# Patient Record
Sex: Female | Born: 1985 | Race: White | Hispanic: No | Marital: Single | Smoking: Never smoker
Health system: Southern US, Community
[De-identification: ages and names within clinical notes are randomized; demographics above are authoritative.]

## PROBLEM LIST (undated history)

## (undated) DIAGNOSIS — I370 Nonrheumatic pulmonary valve stenosis: Secondary | ICD-10-CM

## (undated) DIAGNOSIS — R011 Cardiac murmur, unspecified: Secondary | ICD-10-CM

## (undated) DIAGNOSIS — R55 Syncope and collapse: Secondary | ICD-10-CM

## (undated) HISTORY — DX: Nonrheumatic pulmonary valve stenosis: I37.0

## (undated) HISTORY — PX: OTHER SURGICAL HISTORY: SHX169

## (undated) HISTORY — DX: Syncope and collapse: R55

## (undated) HISTORY — DX: Cardiac murmur, unspecified: R01.1

---

## 2014-05-05 ENCOUNTER — Encounter: Payer: Self-pay | Admitting: Cardiovascular Disease

## 2014-05-05 ENCOUNTER — Encounter (INDEPENDENT_AMBULATORY_CARE_PROVIDER_SITE_OTHER): Payer: Self-pay

## 2014-05-05 ENCOUNTER — Ambulatory Visit (INDEPENDENT_AMBULATORY_CARE_PROVIDER_SITE_OTHER): Payer: BLUE CROSS/BLUE SHIELD | Admitting: Cardiovascular Disease

## 2014-05-05 VITALS — BP 120/78 | HR 68 | Ht 66.0 in | Wt 130.0 lb

## 2014-05-05 DIAGNOSIS — I379 Nonrheumatic pulmonary valve disorder, unspecified: Secondary | ICD-10-CM | POA: Insufficient documentation

## 2014-05-05 DIAGNOSIS — I370 Nonrheumatic pulmonary valve stenosis: Secondary | ICD-10-CM

## 2014-05-05 NOTE — Progress Notes (Signed)
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 She is to follow-up there in a regular basis but has not been seen in more than 5 years. She was told about pulmonary valve regurgitation and right ventricular enlargement during last visit. She denies any symptoms whatsoever. She is not a smoker and has no family history of coronary artery disease.  No Known Allergies   No current outpatient prescriptions on file prior to visit.   No current facility-administered medications on file prior to visit.     Past Medical History  Diagnosis Date  . Heart murmur   . Pulmonary stenosis     Congenital pulmonary valve stenosis status post surgical repair at [age] year old  . Syncope and collapse      Past Surgical History  Procedure Laterality Date  . Open heart surgery      [facility]     Family History  Problem Relation Age of Onset  . Family history unknown: Yes     History   Social History  . Marital Status: Married    Spouse Name: N/A    Number of Children: N/A  . Years of Education: N/A   Occupational History  . Not on file.   Social History Main Topics  . Smoking status: Never Smoker   . Smokeless tobacco: Not on file  . Alcohol Use: Yes     Comment: socially  . Drug Use: No  . Sexual Activity: Not on file   Other Topics Concern  . Not on file   Social History Narrative  . No narrative on file     ROS A 10 point review of system was performed. It is negative other than that mentioned in the history of present illness.   PHYSICAL EXAM   BP 120/78 mmHg  Pulse 68  Ht 5\' 6"  (1.676 m)  Wt 130 lb (58.968 kg)  BMI 20.99 kg/m2 Constitutional: She is oriented to person, place, and time. She appears well-developed and well-nourished. No distress.    HENT: No nasal discharge.  Head: Normocephalic and atraumatic.  Eyes: Pupils are equal and round. No discharge.  Neck: Normal range of motion. Neck supple. No JVD present. No thyromegaly present.  Cardiovascular: Normal rate, regular rhythm, normal heart sounds. Exam reveals no gallop and no friction rub. There is one out of 6 systolic ejection murmur at the pulmonic area. There is 2/6 diastolic murmur at the left sternal border. A surgical scar is noted from the mid chest to the epigastric area. Pulmonary/Chest: Effort normal and breath sounds normal. No stridor. No respiratory distress. She has no wheezes. She has no rales. She exhibits no tenderness.  Abdominal: Soft. Bowel sounds are normal. She exhibits no distension. There is no tenderness. There is no rebound and no guarding.  Musculoskeletal: Normal range of motion. She exhibits no edema and no tenderness.  Neurological: She is alert and oriented to person, place, and time. Coordination normal.  Skin: Skin is warm and dry. No rash noted. She is not diaphoretic. No erythema. No pallor.  Psychiatric: She has a normal mood and affect. Her behavior is normal. Judgment and thought content normal.     EKG: Sinus  Rhythm  -Incomplete right bundle branch block.   ABNORMAL     ASSESSMENT AND PLAN

## 2014-05-05 NOTE — Patient Instructions (Signed)

## 2014-05-05 NOTE — Assessment & Plan Note (Signed)
The patient has history of surgical repair of pulmonary valve stenosis. There is physical exam evidence of pulmonary regurgitation and possible right ventricular enlargement. She is asymptomatic. I requested an echocardiogram for evaluation. She might require pulmonary valve replacement at some point. She reports being followed in the past with cardiac MRIs which might be needed depending on the results of echocardiogram. Endocarditis prophylaxis is not recommended at the present time.

## 2014-05-19 ENCOUNTER — Other Ambulatory Visit (INDEPENDENT_AMBULATORY_CARE_PROVIDER_SITE_OTHER): Payer: BLUE CROSS/BLUE SHIELD

## 2014-05-19 ENCOUNTER — Other Ambulatory Visit: Payer: Self-pay

## 2014-05-19 DIAGNOSIS — R011 Cardiac murmur, unspecified: Secondary | ICD-10-CM

## 2014-05-19 DIAGNOSIS — I370 Nonrheumatic pulmonary valve stenosis: Secondary | ICD-10-CM

## 2014-05-19 DIAGNOSIS — I379 Nonrheumatic pulmonary valve disorder, unspecified: Secondary | ICD-10-CM

## 2015-06-19 ENCOUNTER — Ambulatory Visit (INDEPENDENT_AMBULATORY_CARE_PROVIDER_SITE_OTHER): Payer: BC Managed Care – PPO | Admitting: Cardiovascular Disease

## 2015-06-19 ENCOUNTER — Encounter: Payer: Self-pay | Admitting: Cardiovascular Disease

## 2015-06-19 ENCOUNTER — Encounter (INDEPENDENT_AMBULATORY_CARE_PROVIDER_SITE_OTHER): Payer: Self-pay

## 2015-06-19 VITALS — BP 120/70 | HR 75 | Ht 66.0 in | Wt 126.0 lb

## 2015-06-19 DIAGNOSIS — I379 Nonrheumatic pulmonary valve disorder, unspecified: Secondary | ICD-10-CM | POA: Diagnosis not present

## 2015-06-19 NOTE — Progress Notes (Signed)
Cardiology Office Note   Date:  06/19/2015   ID:  Kathleen Scott, DOB 1985-04-27, MRN [id number]  PCP:  Lorie Phenix, MD  Cardiologist:   Lorine Bears, MD   Chief Complaint  Patient presents with  . other    12 month follow up. "doing well."       History of Present Illness: Kathleen Scott is a 30 y.o. female who presents for a follow up visit.  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 She is to follow-up there in a regular basis but has not been seen in more than 5 years. She was told about pulmonary valve regurgitation and right ventricular enlargement during last visit. She denies any symptoms whatsoever. She is not a smoker and has no family history of coronary artery disease. Most recent echocardiogram in February 2016 in our office showed an ejection fraction of 50-55%, mildly dilated right ventricle with normal systolic function and moderate pulmonary valve regurgitation without evidence of pulmonary hypertension.    Past Medical History  Diagnosis Date  . Heart murmur   . Pulmonary stenosis     Congenital pulmonary valve stenosis status post surgical repair at one day old  . Syncope and collapse     Past Surgical History  Procedure Laterality Date  . Open heart surgery      [facility]     No current outpatient prescriptions on file.   No current facility-administered medications for this visit.    Allergies:   Review of patient's allergies indicates no known allergies.    Social History:  The patient  reports that she has never smoked. She does not have any smokeless tobacco history on file. She reports that she drinks alcohol. She reports that she does not use illicit drugs.   Family History:  The patient's Family history is unknown by patient.    ROS:  Please see the history of present illness.   Otherwise, review of systems are positive for  none.   All other systems are reviewed and negative.    PHYSICAL EXAM: VS:  BP 120/70 mmHg  Pulse 75  Ht  (1.676 m)  Wt 126 lb (57.153 kg)  BMI 20.35 kg/m2 , BMI Body mass index is 20.35 kg/(m^2). GEN: Well nourished, well developed, in no acute distress HEENT: normal Neck: no JVD, carotid bruits, or masses Cardiac: RRR; there is a 2/6 diastolic decrescendo murmur in the pulmonic area with a right sided S3 gallop. Respiratory:  clear to auscultation bilaterally, normal work of breathing GI: soft, nontender, nondistended, + BS MS: no deformity or atrophy Skin: warm and dry, no rash Neuro:  Strength and sensation are intact Psych: euthymic mood, full affect   EKG:  EKG is ordered today. The ekg ordered today demonstrates normal sinus rhythm with significant ST or T wave changes.   Recent Labs: No results found for requested labs within last 365 days.    Lipid Panel No results found for: CHOL, TRIG, HDL, CHOLHDL, VLDL, LDLCALC, LDLDIRECT    Wt Readings from Last 3 Encounters:  06/19/15 126 lb (57.153 kg)  05/05/14 130 lb (58.968 kg)        ASSESSMENT AND PLAN:  1.  Pulmonary valve disease: The patient has history of surgical repair of pulmonary valve stenosis. She is known to have moderate pulmonary valve regurgitation. Her physical exam today is suggestive of a right-sided  S3 gallop.  Due to that, I requested an echocardiogram. She is otherwise asymptomatic.     Disposition:   FU with me in 1 year  Signed,  Lorine BearsMuhammad Arida, MD  06/19/2015 7:14 PM     [facility]

## 2015-06-19 NOTE — Patient Instructions (Addendum)
Labwork: None Ordered  Testing/Procedures: Your physician has requested that you have an echocardiogram. Echocardiography is a painless test that uses sound waves to create images of your heart. It provides your doctor with information about the size and shape of your heart and how well your heart's chambers and valves are working. This procedure takes approximately one hour. There are no restrictions for this procedure.  Date & Time:______________________________________________________________  Follow-Up: Your physician wants you to follow-up in: 1 year with Dr. Kirke CorinArida. You will receive a reminder letter in the mail two months in advance. If you don't receive a letter, please call our office to schedule the follow-up appointment.   Any Other Special Instructions Will Be Listed Below (If Applicable).     If you need a refill on your cardiac medications before your next appointment, please call your pharmacy.  Echocardiogram An echocardiogram, or echocardiography, uses sound waves (ultrasound) to produce an image of your heart. The echocardiogram is simple, painless, obtained within a short period of time, and offers valuable information to your health care provider. The images from an echocardiogram can provide information such as:  Evidence of coronary artery disease (CAD).  Heart size.  Heart muscle function.  Heart valve function.  Aneurysm detection.  Evidence of a past heart attack.  Fluid buildup around the heart.  Heart muscle thickening.  Assess heart valve function. LET [facility] PROVID[facility] KNOW ABOUT:  Any allergies you have.  All medicines you are taking, including vitamins, herbs, eye drops, creams, and over-the-counter medicines.  Previous problems you or members of your family have had with the use of anesthetics.  Any blood disorders you have.  Previous surgeries you have had.  Medical conditions you have.  Possibility of pregnancy, if this  applies. BEFORE THE PROCEDURE  No special preparation is needed. Eat and drink normally.  PROCEDURE   In order to produce an image of your heart, gel will be applied to your chest and a wand-like tool (transducer) will be moved over your chest. The gel will help transmit the sound waves from the transducer. The sound waves will harmlessly bounce off your heart to allow the heart images to be captured in real-time motion. These images will then be recorded.  You may need an IV to receive a medicine that improves the quality of the pictures. AFTER THE PROCEDURE You may return to your normal schedule including diet, activities, and medicines, unless your health care provider tells you otherwise.   This information is not intended to replace advice given to you by your health care provider. Make sure you discuss any questions you have with your health care provider.   Document Released: 03/18/2000 Document Revised: 04/11/2014 Document Reviewed: 11/26/2012 Elsevier Interactive Patient Education [employer]2016 Elsevier Inc.

## 2015-07-02 ENCOUNTER — Other Ambulatory Visit: Payer: Self-pay

## 2015-07-02 ENCOUNTER — Ambulatory Visit (INDEPENDENT_AMBULATORY_CARE_PROVIDER_SITE_OTHER): Payer: BC Managed Care – PPO

## 2015-07-02 DIAGNOSIS — I379 Nonrheumatic pulmonary valve disorder, unspecified: Secondary | ICD-10-CM | POA: Diagnosis not present

## 2016-09-06 ENCOUNTER — Ambulatory Visit (INDEPENDENT_AMBULATORY_CARE_PROVIDER_SITE_OTHER): Payer: BC Managed Care – PPO | Admitting: Cardiovascular Disease

## 2016-09-06 ENCOUNTER — Encounter: Payer: Self-pay | Admitting: Cardiovascular Disease

## 2016-09-06 VITALS — BP 100/62 | HR 70 | Ht 66.0 in | Wt 131.2 lb

## 2016-09-06 DIAGNOSIS — I379 Nonrheumatic pulmonary valve disorder, unspecified: Secondary | ICD-10-CM

## 2016-09-06 NOTE — Patient Instructions (Signed)
Medication Instructions: None.   Labwork: None.   Procedures/Testing: None.   Follow-Up: 1 year with Dr. Kirke CorinArida.   Any Additional Special Instructions Will Be Listed Below (If Applicable).     If you need a refill on your cardiac medications before your next appointment, please call your pharmacy.

## 2016-09-06 NOTE — Progress Notes (Signed)
Cardiology Office Note   Date:  09/06/2016   ID:  Kathleen Scott, DOB 01-20-1986, MRN [id number]  PCP:  Lorie Phenix, MD  Cardiologist:   Lorine Bears, MD   Chief Complaint  Patient presents with  . other    12 month follow up. Meds reviewed by the pt. verbally. "doing well."       History of Present Illness: Kathleen Scott is a 31 y.o. female who presents for a follow up visit.  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  She has known moderate pulmonary valve regurgitation with mild RV enlargement.  Most recent echocardiogram in March 2017 showed an EF of 50-55%, mild mitral regurgitation, mild tricuspid regurgitation with no pulmonary hypertension, moderate pulmonary valve regurgitation with mildly dilated right ventricle.  She has been doing extremely well with no cardiac symptoms. No chest pain, shortness of breath or palpitations. She continues to work as a Runner, broadcasting/film/video.   Past Medical History:  Diagnosis Date  . Heart murmur   . Pulmonary stenosis    Congenital pulmonary valve stenosis status post surgical repair at one day old  . Syncope and collapse     Past Surgical History:  Procedure Laterality Date  . open heart surgery     [facility]     No current outpatient prescriptions on file.   No current facility-administered medications for this visit.     Allergies:   Patient has no known allergies.    Social History:  The patient  reports that she has never smoked. She has never used smokeless tobacco. She reports that she drinks alcohol. She reports that she does not use drugs.   Family History:  The patient's Family history is unknown by patient.    ROS:  Please see the history of present illness.   Otherwise, review of systems are positive for none.   All other systems are reviewed and negative.    PHYSICAL EXAM: VS:  BP 100/62 (BP Location:  Left Arm, Patient Position: Sitting, Cuff Size: Normal)   Pulse 70   Ht 5\' 6"  (1.676 m)   Wt 131 lb 4 oz (59.5 kg)   BMI 21.18 kg/m  , BMI Body mass index is 21.18 kg/m. GEN: Well nourished, well developed, in no acute distress  HEENT: normal  Neck: no JVD, carotid bruits, or masses Cardiac: RRR; there is a 2/6 diastolic decrescendo murmur in the pulmonic area . Respiratory:  clear to auscultation bilaterally, normal work of breathing GI: soft, nontender, nondistended, + BS MS: no deformity or atrophy  Skin: warm and dry, no rash Neuro:  Strength and sensation are intact Psych: euthymic mood, full affect   EKG:  EKG is ordered today. The ekg ordered today demonstrates normal sinus rhythm with significant ST or T wave changes.   Recent Labs: No results found for requested labs within last 8760 hours.    Lipid Panel No results found for: CHOL, TRIG, HDL, CHOLHDL, VLDL, LDLCALC, LDLDIRECT    Wt Readings from Last 3 Encounters:  09/06/16 131 lb 4 oz (59.5 kg)  06/19/15 126 lb (57.2 kg)  05/05/14 130 lb (59 kg)        ASSESSMENT AND PLAN:  1.  Pulmonary valve disease: The patient has history of surgical repair of pulmonary valve stenosis. She is known to have moderate pulmonary valve regurgitation. This was stable on echocardiogram from last  year which was reviewed with her today. She is asymptomatic.    Disposition:   FU with me in 1 year  Signed,  Lorine BearsMuhammad Arida, MD  09/06/2016 5:10 PM    [facility]

## 2017-09-25 ENCOUNTER — Encounter: Payer: Self-pay | Admitting: Family Medicine

## 2017-09-25 ENCOUNTER — Encounter (INDEPENDENT_AMBULATORY_CARE_PROVIDER_SITE_OTHER): Payer: Self-pay

## 2017-09-25 ENCOUNTER — Ambulatory Visit: Payer: BC Managed Care – PPO | Admitting: Family Medicine

## 2017-09-25 VITALS — BP 108/70 | HR 72 | Temp 98.4°F | Ht 66.5 in | Wt 137.5 lb

## 2017-09-25 DIAGNOSIS — N926 Irregular menstruation, unspecified: Secondary | ICD-10-CM | POA: Diagnosis not present

## 2017-09-25 DIAGNOSIS — Z3201 Encounter for pregnancy test, result positive: Secondary | ICD-10-CM

## 2017-09-25 DIAGNOSIS — Z7689 Persons encountering health services in other specified circumstances: Secondary | ICD-10-CM

## 2017-09-25 DIAGNOSIS — I379 Nonrheumatic pulmonary valve disorder, unspecified: Secondary | ICD-10-CM | POA: Diagnosis not present

## 2017-09-25 LAB — POCT URINE PREGNANCY: Preg Test, Ur: POSITIVE — AB

## 2017-09-25 NOTE — Patient Instructions (Signed)
First Trimester of Pregnancy The first trimester of pregnancy is from week 1 until the end of week 13 (months 1 through 3). During this time, your baby will begin to develop inside you. At 6-8 weeks, the eyes and face are formed, and the heartbeat can be seen on ultrasound. At the end of 12 weeks, all the baby's organs are formed. Prenatal care is all the medical care you receive before the birth of your baby. Make sure you get good prenatal care and follow all of your doctor's instructions. Follow these instructions at home: Medicines  Take over-the-counter and prescription medicines only as told by your doctor. Some medicines are safe and some medicines are not safe during pregnancy.  Take a prenatal vitamin that contains at least 600 micrograms (mcg) of folic acid.  If you have trouble pooping (constipation), take medicine that will make your stool soft (stool softener) if your doctor approves. Eating and drinking  Eat regular, healthy meals.  Your doctor will tell you the amount of weight gain that is right for you.  Avoid raw meat and uncooked cheese.  If you feel sick to your stomach (nauseous) or throw up (vomit): ? Eat 4 or 5 small meals a day instead of 3 large meals. ? Try eating a few soda crackers. ? Drink liquids between meals instead of during meals.  To prevent constipation: ? Eat foods that are high in fiber, like fresh fruits and vegetables, whole grains, and beans. ? Drink enough fluids to keep your pee (urine) clear or pale yellow. Activity  Exercise only as told by your doctor. Stop exercising if you have cramps or pain in your lower belly (abdomen) or low back.  Do not exercise if it is too hot, too humid, or if you are in a place of great height (high altitude).  Try to avoid standing for long periods of time. Move your legs often if you must stand in one place for a long time.  Avoid heavy lifting.  Wear low-heeled shoes. Sit and stand up straight.  You  can have sex unless your doctor tells you not to. Relieving pain and discomfort  Wear a good support bra if your breasts are sore.  Take warm water baths (sitz baths) to soothe pain or discomfort caused by hemorrhoids. Use hemorrhoid cream if your doctor says it is okay.  Rest with your legs raised if you have leg cramps or low back pain.  If you have puffy, bulging veins (varicose veins) in your legs: ? Wear support hose or compression stockings as told by your doctor. ? Raise (elevate) your feet for 15 minutes, 3-4 times a day. ? Limit salt in your food. Prenatal care  Schedule your prenatal visits by the twelfth week of pregnancy.  Write down your questions. Take them to your prenatal visits.  Keep all your prenatal visits as told by your doctor. This is important. Safety  Wear your seat belt at all times when driving.  Make a list of emergency phone numbers. The list should include numbers for family, friends, the hospital, and police and fire departments. General instructions  Ask your doctor for a referral to a local prenatal class. Begin classes no later than at the start of month 6 of your pregnancy.  Ask for help if you need counseling or if you need help with nutrition. Your doctor can give you advice or tell you where to go for help.  Do not use hot tubs, steam rooms, or   saunas.  Do not douche or use tampons or scented sanitary pads.  Do not cross your legs for long periods of time.  Avoid all herbs and alcohol. Avoid drugs that are not approved by your doctor.  Do not use any tobacco products, including cigarettes, chewing tobacco, and electronic cigarettes. If you need help quitting, ask your doctor. You may get counseling or other support to help you quit.  Avoid cat litter boxes and soil used by cats. These carry germs that can cause birth defects in the baby and can cause a loss of your baby (miscarriage) or stillbirth.  Visit your dentist. At home, brush  your teeth with a soft toothbrush. Be gentle when you floss. Contact a doctor if:  You are dizzy.  You have mild cramps or pressure in your lower belly.  You have a nagging pain in your belly area.  You continue to feel sick to your stomach, you throw up, or you have watery poop (diarrhea).  You have a bad smelling fluid coming from your vagina.  You have pain when you pee (urinate).  You have increased puffiness (swelling) in your face, hands, legs, or ankles. Get help right away if:  You have a fever.  You are leaking fluid from your vagina.  You have spotting or bleeding from your vagina.  You have very bad belly cramping or pain.  You gain or lose weight rapidly.  You throw up blood. It may look like coffee grounds.  You are around people who have German measles, fifth disease, or chickenpox.  You have a very bad headache.  You have shortness of breath.  You have any kind of trauma, such as from a fall or a car accident. Summary  The first trimester of pregnancy is from week 1 until the end of week 13 (months 1 through 3).  To take care of yourself and your unborn baby, you will need to eat healthy meals, take medicines only if your doctor tells you to do so, and do activities that are safe for you and your baby.  Keep all follow-up visits as told by your doctor. This is important as your doctor will have to ensure that your baby is healthy and growing well. This information is not intended to replace advice given to you by your health care provider. Make sure you discuss any questions you have with your health care provider. Document Released: 09/07/2007 Document Revised: 03/29/2016 Document Reviewed: 03/29/2016 Elsevier Interactive Patient Education  2017 Elsevier Inc.  

## 2017-09-25 NOTE — Progress Notes (Signed)
   Subjective:    Patient ID: Kathleen Scott, female    DOB: 23-Sep-1985, 32 y.o.   MRN: [id number]  HPI This is a 32 yo female who presents today to establish care. Is accompanied by her boyfriend.   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 Enjoys hanging out with friends, walking, exercise.    Last CPE-  Several years Pap- 5-6 years ago, never abnormal Eye- not for many years Dental-  Twice a year Exercise- boot camp  Patient's last menstrual period was 07/31/2017. Had positive home pregnancy test. Some nausea, some breast tenderness. She requests referral to ob.   Congenital pulmonary valve stenosis with post surgical repair- sees cardiologist annually. Last OV 09/06/16. Note reviewed. Follow up in 1 year. Patient did not realize she is overdue. She denies chest pain, SOB, edema, palpitations.   Past Medical History:  Diagnosis Date  . Heart murmur   . Pulmonary stenosis    Congenital pulmonary valve stenosis status post surgical repair at one day old  . Syncope and collapse    Past Surgical History:  Procedure Laterality Date  . open heart surgery     [facility]   Family History  Family history unknown: Yes   Social History   Tobacco Use  . Smoking status: Never Smoker  . Smokeless tobacco: Never Used  Substance Use Topics  . Alcohol use: Not Currently  . Drug use: No      Review of Systems  Constitutional: Positive for fatigue.  Respiratory: Negative for chest tightness and shortness of breath.   Cardiovascular: Negative for chest pain, palpitations and leg swelling.  Gastrointestinal: Positive for nausea. Negative for vomiting.  Psychiatric/Behavioral: Negative for dysphoric mood and sleep disturbance. The patient is not nervous/anxious.        Objective:   Physical Exam  Constitutional: She is oriented to person, place, and time. She appears well-developed and well-nourished. No distress.  HENT:  Head: Normocephalic and atraumatic.    Eyes: Conjunctivae are normal.  Cardiovascular: Normal rate and regular rhythm.  Murmur (3/6) heard. Pulmonary/Chest: Effort normal and breath sounds normal.  Musculoskeletal: She exhibits no edema.  Neurological: She is alert and oriented to person, place, and time.  Skin: Skin is warm and dry. She is not diaphoretic.  Psychiatric: She has a normal mood and affect. Her behavior is normal. Judgment and thought content normal.  Vitals reviewed.     BP 108/70 (BP Location: Left Arm, Patient Position: Sitting, Cuff Size: Normal)   Pulse 72   Temp 98.4 F (36.9 C) (Oral)   Ht 5' 6.5" (1.689 m)   Wt 137 lb 8 oz (62.4 kg)   LMP 07/31/2017   SpO2 98%   BMI 21.86 kg/m  Results for orders placed or performed in visit on 09/25/17  POCT urine pregnancy  Result Value Ref Range   Preg Test, Ur Positive (A) Negative       Assessment & Plan:  1. Encounter to establish care - Available records reviewed - no recent care - will defer labs to ob/gyn  2. Missed period - POCT urine pregnancy  3. Positive pregnancy test - provided information about first trimester of pregnancy, advised to avoid ETOH,  Tobacco, drugs - Ambulatory referral to Obstetrics / Gynecology  4. Pulmonary valve disease - she will make follow up with cardilogy   Olean Reeeborah Sonnia Strong, FNP-BC  [facility], [facility]  09/25/2017 10:20 AM

## 2017-10-04 ENCOUNTER — Encounter: Payer: Self-pay | Admitting: Nurse Practitioner

## 2017-10-04 ENCOUNTER — Other Ambulatory Visit (HOSPITAL_COMMUNITY)
Admission: RE | Admit: 2017-10-04 | Discharge: 2017-10-04 | Disposition: A | Discharge status: Home / Self Care | Payer: BC Managed Care – PPO | Source: Ambulatory Visit | Attending: Obstetrics & Gynecology | Admitting: Obstetrics & Gynecology

## 2017-10-04 ENCOUNTER — Encounter: Payer: Self-pay | Admitting: Obstetrics & Gynecology

## 2017-10-04 ENCOUNTER — Ambulatory Visit (INDEPENDENT_AMBULATORY_CARE_PROVIDER_SITE_OTHER): Payer: BC Managed Care – PPO | Admitting: Obstetrics & Gynecology

## 2017-10-04 ENCOUNTER — Ambulatory Visit: Payer: BC Managed Care – PPO | Admitting: Nurse Practitioner

## 2017-10-04 VITALS — BP 100/70 | HR 59 | Ht 66.0 in | Wt 137.2 lb

## 2017-10-04 VITALS — BP 120/80 | Wt 138.0 lb

## 2017-10-04 DIAGNOSIS — Z3A09 9 weeks gestation of pregnancy: Secondary | ICD-10-CM | POA: Insufficient documentation

## 2017-10-04 DIAGNOSIS — I371 Nonrheumatic pulmonary valve insufficiency: Secondary | ICD-10-CM | POA: Diagnosis not present

## 2017-10-04 DIAGNOSIS — Z8679 Personal history of other diseases of the circulatory system: Secondary | ICD-10-CM | POA: Insufficient documentation

## 2017-10-04 DIAGNOSIS — N926 Irregular menstruation, unspecified: Secondary | ICD-10-CM

## 2017-10-04 DIAGNOSIS — O0991 Supervision of high risk pregnancy, unspecified, first trimester: Secondary | ICD-10-CM | POA: Diagnosis present

## 2017-10-04 LAB — OB RESULTS CONSOLE GC/CHLAMYDIA: Gonorrhea: NEGATIVE

## 2017-10-04 LAB — OB RESULTS CONSOLE VARICELLA ZOSTER ANTIBODY, IGG: Varicella: IMMUNE

## 2017-10-04 MED ORDER — DOXYLAMINE-PYRIDOXINE ER 20-20 MG PO TBCR: 1.0000 | EXTENDED_RELEASE_TABLET | Freq: Two times a day (BID) | ORAL | 3 refills | Status: DC

## 2017-10-04 MED ORDER — ONDANSETRON 4 MG PO TBDP: 4.0000 mg | ORAL_TABLET | Freq: Four times a day (QID) | ORAL | 0 refills | Status: DC | PRN

## 2017-10-04 NOTE — Progress Notes (Signed)
10/04/2017   Chief Complaint: Missed period  Transfer of Care Patient: no  History of Present Illness: Kathleen Scott is a 32 y.o. G1P0 [redacted]w[redacted]d based on Patient's last menstrual period was 07/31/2017. with an Estimated Date of Delivery: 05/07/18, with the above CC.   Her periods were: regular periods every 28 days She was using no method when she conceived.  She has Positive signs or symptoms of nausea/vomiting of pregnancy. 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  OBGYN History: As per HPI. OB History  Gravida Para Term Preterm AB Living  1            SAB TAB Ectopic Multiple Live Births               # Outcome Date GA Lbr Len/2nd Weight Sex Delivery Anes PTL Lv  1 Current             Any issues with any prior pregnancies: not applicable Any prior children are healthy, doing well, without any problems or issues: no History of pap smears: Yes. Last pap smear 2018. Abnormal: no History of STIs: No   Past Medical History: Past Medical History:  Diagnosis Date  . Congenital Pulmonic Stenosis    a. 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 06/2015 Echo: EF 50-55%, septal wall HK 2/2 post-op state. Mild MR. Mildly dil RV size. Nl RV fxn. Triv PR. Nl PASP.  Marland Kitchen Heart murmur   . Syncope and collapse     Past Surgical History: Past Surgical History:  Procedure Laterality Date  . open heart surgery     [facility]    Family History:  Family History  Family history unknown: Yes   She denies any female cancers, bleeding or blood clotting disorders.  She denies  any history of mental retardation, birth defects or genetic disorders in her or the FOB's history  Social History:  Social History   Socioeconomic History  . Marital status: Single    Spouse name: Not on file  . Number of children: Not on file  . Years of education: Not on file  . Highest education level: Not on file  Occupational History  . Not on file  Social Needs  . Financial resource strain: Not on file  . Food insecurity:    Worry: Not on file    Inability: Not on file  . Transportation needs:    Medical: Not on file    Non-medical: Not on file  Tobacco Use  . Smoking status: Never Smoker  . Smokeless tobacco: Never Used  Substance and Sexual Activity  . Alcohol use: Not Currently  . Drug use: No  . Sexual activity: Not on file  Lifestyle  . Physical activity:    Days per week: Not on file    Minutes per session: Not on file  . Stress: Not on file  Relationships  . Social connections:    Talks on phone: Not on file    Gets together: Not on file    Attends religious service: Not on file    Active  member of club or organization: Not on file    Attends meetings of clubs or organizations: Not on file    Relationship status: Not on file  . Intimate partner violence:    Fear of current or ex partner: Not on file    Emotionally abused: Not on file    Physically abused: Not on file    Forced sexual activity: Not on file  Other Topics Concern  . Not on file  Social History Narrative  . Not on file   Any pets in the household: no  Allergy: No Known Allergies  Current Outpatient Medications:  Current Outpatient Medications:  .  Doxylamine-Pyridoxine ER (BONJESTA) 20-20 MG TBCR, Take 1 tablet by mouth 2 (two) times daily., Disp: 60 tablet, Rfl: 3 .  ondansetron (ZOFRAN ODT) 4 MG disintegrating tablet, Take 1 tablet (4 mg total) by mouth every 6 (six) hours as needed for nausea., Disp: 20 tablet, Rfl: 0 .  Prenatal Vit-Fe Fumarate-FA (PRENATAL MULTIVITAMIN) TABS  tablet, Take 1 tablet by mouth daily at 12 noon., Disp: , Rfl:    Physical Exam:   BP 120/80   Wt 138 lb (62.6 kg)   LMP 07/31/2017   BMI 21.94 kg/m  Body mass index is 21.94 kg/m. Constitutional: Well nourished, well developed female in no acute distress.  Neck:  Supple, normal appearance, and no thyromegaly  Cardiovascular: S1, S2 normal, no murmur, rub or gallop, regular rate and rhythm Respiratory:  Clear to auscultation bilateral. Normal respiratory effort Abdomen: positive bowel sounds and no masses, hernias; diffusely non tender to palpation, non distended Breasts: breasts appear normal, no suspicious masses, no skin or nipple changes or axillary nodes. Neuro/Psych:  Normal mood and affect.  Skin:  Warm and dry.  Lymphatic:  No inguinal lymphadenopathy.   Pelvic exam: is not limited by body habitus EGBUS: within normal limits, Vagina: within normal limits and with no blood in the vault, Cervix: normal appearing cervix without discharge or lesions, closed/long/high, Uterus:  enlarged: 8 wks, and Adnexa:  normal adnexa  Assessment: [name] is a 32 y.o. G1P0 7683w2d based on Patient's last menstrual period was 07/31/2017. with an Estimated Date of Delivery: 05/07/18,  for prenatal care.  Plan:  1) Avoid alcoholic beverages. 2) Patient encouraged not to smoke.  3) Discontinue the use of all non-medicinal drugs and chemicals.  4) Take prenatal vitamins daily.  5) Seatbelt use advised 6) Nutrition, food safety (fish, cheese advisories, and high nitrite foods) and exercise discussed. 7) Hospital and practice style delivering [facility] discussed  8) Patient is asked about travel to areas at risk for the Zika virus, and counseled to avoid travel and exposure to mosquitoes or sexual partners who may have themselves been exposed to the virus. Testing is discussed, and will be ordered as appropriate.  9) [facility]birth [facility] advised 10) Genetic Screening, such as with 1st Trimester  Screening, cell free fetal DNA, AFP testing, and Ultrasound, as well as with amniocentesis and CVS as appropriate, is discussed with patient. She plans to have genetic testing this pregnancy. 11) Plan fetal ECHO due to her h/o pulm valve stenosis 12) Bonjesta and Zofran for nausea 13) US soon  Problem list reviewed and updated.  Annamarie MajorPaul Shamon Lobo, MD, Merlinda FrederickFACOG [facility], [facility] 10/04/2017  1:45 PM

## 2017-10-04 NOTE — Patient Instructions (Signed)
Medication Instructions:  Your physician recommends that you continue on your current medications as directed. Please refer to the Current Medication list given to you today.   Labwork: None ordered  Testing/Procedures: Your physician has requested that you have an echocardiogram. Echocardiography is a painless test that uses sound waves to create images of your heart. It provides your doctor with information about the size and shape of your heart and how well your heart's chambers and valves are working. This procedure takes approximately one hour. There are no restrictions for this procedure.   Follow-Up: Your physician recommends that you schedule a follow-up appointment in: 3 months with Dr.Arida   Any Other Special Instructions Will Be Listed Below (If Applicable).     If you need a refill on your cardiac medications before your next appointment, please call your pharmacy.

## 2017-10-04 NOTE — Progress Notes (Signed)
Office Visit    Patient Name: Kathleen Scott Date of Encounter: 10/04/2017  Primary Care Provider:  Emi BelfastGessner, Deborah B, FNP Primary Cardiologist:  Lorine BearsMuhammad Arida, MD  Chief Complaint    32 year old female with a history of congenital pulmonic stenosis status post surgical repair at 91-day-old with residual pulmonary regurgitation, who presents for follow-up after recent discovery of pregnancy.  Past Medical History    Past Medical History:  Diagnosis Date  . Congenital Pulmonic Stenosis    a. 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 06/2015 Echo: EF 50-55%, septal wall HK 2/2 post-op state. Mild MR. Mildly dil RV size. Nl RV fxn. Triv PR. Nl PASP.  Marland Kitchen. Heart murmur   . Syncope and collapse    Past Surgical History:  Procedure Laterality Date  . open heart [facility]    Allergies  No Known Allergies  History of Present Illness    32 year old female with a prior history of congenital pulmonary valve stenosis status post surgical repair when she was 261-day-old.  [facility]  She has known pulmonary valve regurgitation which was previously thought to be moderate.  Echocardiogram in March 2017 showed mild mitral regurgitation and trivial pulmonary regurgitation.  The right ventricle was stable and mildly dilated with normal function.  She was asymptomatic when last seen in clinic in June 2018.  She has done well over the past year.  She has remained active without symptoms or limitations.  She denies chest pain, palpitations, dyspnea, or edema.  She recently discovered that she is pregnant and as result set up follow-up with us today.  Home Medications    Prior to Admission medications   Medication Sig Start Date End Date Taking? Authorizing Provider  Prenatal Vit-Fe Fumarate-FA (PRENATAL MULTIVITAMIN) TABS tablet Take 1 tablet by mouth daily at 12 noon.    [provider]    Review of Systems    She denies chest pain, palpitations, dyspnea, pnd, orthopnea, n, v, dizziness, syncope, edema, weight gain, or early satiety.  All other systems reviewed and are otherwise negative except as noted above.  Physical Exam    VS:  BP 100/70 (BP Location: Left Arm, Patient Position: Sitting, Cuff Size: Normal)   Pulse (!) 59   Ht 5\' 6"  (1.676 m)   Wt 137 lb 4 oz (62.3 kg)   LMP 07/31/2017   BMI 22.15 kg/m  , BMI Body mass index is 22.15 kg/m. GEN: Well nourished, well developed, in no acute distress.  HEENT: normal.  Neck: Supple, no JVD, carotid bruits, or masses. Cardiac: RRR, soft systolic murmur at the left upper sternal border.  I do not appreciate a diastolic murmur today.  No rubs, or gallops. No clubbing, cyanosis, edema.  Radials/DP/PT 2+ and equal bilaterally.  Respiratory:  Respirations regular and unlabored, clear to auscultation bilaterally. GI: Soft, nontender, nondistended, BS + x 4. MS: no deformity or atrophy. Skin: warm and dry, no rash. Neuro:  Strength and sensation are intact. Psych: Normal affect.  Accessory Clinical Findings    ECG -sinus bradycardia, 59, with occasional PVCs.  Left atrial enlargement.  Assessment & Plan    1.  Pulmonary valve regurgitation: Patient with a prior history of pulmonic stenosis status post surgery at age 3 day and subsequent development of pulmonary regurgitation.  This was previously noted to be moderate in severity and stable by echo in 2017.  She recently discovered  that she is pregnant.  She has been asymptomatic over the past year and has remained active.  I will follow-up in echocardiogram to reevaluate pulmonary regurgitation and RV function.  With history of moderate pulmonary regurgitation, she is WHO risk class II, with recommendation to be f/u once a trimester.  We will plan to see her back in 3 months.  2.  Pregnancy: Saw OB/GYN today.  Plan for fetal echocardiogram at 22 weeks.  3.  Disposition: Follow-up echocardiogram.   Patient will follow-up in 3 months or sooner if necessary.   Nicolasa Ducking, NP 10/04/2017, 3:22 PM

## 2017-10-04 NOTE — Patient Instructions (Signed)
First Trimester of Pregnancy The first trimester of pregnancy is from week 1 until the end of week 13 (months 1 through 3). A week after a sperm fertilizes an egg, the egg will implant on the wall of the uterus. This embryo will begin to develop into a baby. Genes from you and your partner will form the baby. The female genes will determine whether the baby will be a boy or a girl. At 6-8 weeks, the eyes and face will be formed, and the heartbeat can be seen on ultrasound. At the end of 12 weeks, all the baby's organs will be formed. Now that you are pregnant, you will want to do everything you can to have a healthy baby. Two of the most important things are to get good prenatal care and to follow your health care provider's instructions. Prenatal care is all the medical care you receive before the baby's birth. This care will help prevent, find, and treat any problems during the pregnancy and childbirth. Body changes during your first trimester Your body goes through many changes during pregnancy. The changes vary from woman to woman.  You may gain or lose a couple of pounds at first.  You may feel sick to your stomach (nauseous) and you may throw up (vomit). If the vomiting is uncontrollable, call your health care provider.  You may tire easily.  You may develop headaches that can be relieved by medicines. All medicines should be approved by your health care provider.  You may urinate more often. Painful urination may mean you have a bladder infection.  You may develop heartburn as a result of your pregnancy.  You may develop constipation because certain hormones are causing the muscles that push stool through your intestines to slow down.  You may develop hemorrhoids or swollen veins (varicose veins).  Your breasts may begin to grow larger and become tender. Your nipples may stick out more, and the tissue that surrounds them (areola) may become darker.  Your gums may bleed and may be  sensitive to brushing and flossing.  Dark spots or blotches (chloasma, mask of pregnancy) may develop on your face. This will likely fade after the baby is born.  Your menstrual periods will stop.  You may have a loss of appetite.  You may develop cravings for certain kinds of food.  You may have changes in your emotions from day to day, such as being excited to be pregnant or being concerned that something may go wrong with the pregnancy and baby.  You may have more vivid and strange dreams.  You may have changes in your hair. These can include thickening of your hair, rapid growth, and changes in texture. Some women also have hair loss during or after pregnancy, or hair that feels dry or thin. Your hair will most likely return to normal after your baby is born.  What to expect at prenatal visits During a routine prenatal visit:  You will be weighed to make sure you and the baby are growing normally.  Your blood pressure will be taken.  Your abdomen will be measured to track your baby's growth.  The fetal heartbeat will be listened to between weeks 10 and 14 of your pregnancy.  Test results from any previous visits will be discussed.  Your health care provider may ask you:  How you are feeling.  If you are feeling the baby move.  If you have had any abnormal symptoms, such as leaking fluid, bleeding, severe headaches,   or abdominal cramping.  If you are using any tobacco products, including cigarettes, chewing tobacco, and electronic cigarettes.  If you have any questions.  Other tests that may be performed during your first trimester include:  Blood tests to find your blood type and to check for the presence of any previous infections. The tests will also be used to check for low iron levels (anemia) and protein on red blood cells (Rh antibodies). Depending on your risk factors, or if you previously had diabetes during pregnancy, you may have tests to check for high blood  sugar that affects pregnant women (gestational diabetes).  Urine tests to check for infections, diabetes, or protein in the urine.  An ultrasound to confirm the proper growth and development of the baby.  Fetal screens for spinal cord problems (spina bifida) and Down syndrome.  HIV (human immunodeficiency virus) testing. Routine prenatal testing includes screening for HIV, unless you choose not to have this test.  You may need other tests to make sure you and the baby are doing well.  Follow these instructions at home: Medicines  Follow your health care provider's instructions regarding medicine use. Specific medicines may be either safe or unsafe to take during pregnancy.  Take a prenatal vitamin that contains at least 600 micrograms (mcg) of folic acid.  If you develop constipation, try taking a stool softener if your health care provider approves. Eating and drinking  Eat a balanced diet that includes fresh fruits and vegetables, whole grains, good sources of protein such as meat, eggs, or tofu, and low-fat dairy. Your health care provider will help you determine the amount of weight gain that is right for you.  Avoid raw meat and uncooked cheese. These carry germs that can cause birth defects in the baby.  Eating four or five small meals rather than three large meals a day may help relieve nausea and vomiting. If you start to feel nauseous, eating a few soda crackers can be helpful. Drinking liquids between meals, instead of during meals, also seems to help ease nausea and vomiting.  Limit foods that are high in fat and processed sugars, such as fried and sweet foods.  To prevent constipation: ? Eat foods that are high in fiber, such as fresh fruits and vegetables, whole grains, and beans. ? Drink enough fluid to keep your urine clear or pale yellow. Activity  Exercise only as directed by your health care provider. Most women can continue their usual exercise routine during  pregnancy. Try to exercise for 30 minutes at least 5 days a week. Exercising will help you: ? Control your weight. ? Stay in shape. ? Be prepared for labor and delivery.  Experiencing pain or cramping in the lower abdomen or lower back is a good sign that you should stop exercising. Check with your health care provider before continuing with normal exercises.  Try to avoid standing for long periods of time. Move your legs often if you must stand in one place for a long time.  Avoid heavy lifting.  Wear low-heeled shoes and practice good posture.  You may continue to have sex unless your health care provider tells you not to. Relieving pain and discomfort  Wear a good support bra to relieve breast tenderness.  Take warm sitz baths to soothe any pain or discomfort caused by hemorrhoids. Use hemorrhoid cream if your health care provider approves.  Rest with your legs elevated if you have leg cramps or low back pain.  If you develop   varicose veins in your legs, wear support hose. Elevate your feet for 15 minutes, 3-4 times a day. Limit salt in your diet. Prenatal care  Schedule your prenatal visits by the twelfth week of pregnancy. They are usually scheduled monthly at first, then more often in the last 2 months before delivery.  Write down your questions. Take them to your prenatal visits.  Keep all your prenatal visits as told by your health care provider. This is important. Safety  Wear your seat belt at all times when driving.  Make a list of emergency phone numbers, including numbers for family, friends, the hospital, and police and fire departments. General instructions  Ask your health care provider for a referral to a local prenatal education class. Begin classes no later than the beginning of month 6 of your pregnancy.  Ask for help if you have counseling or nutritional needs during pregnancy. Your health care provider can offer advice or refer you to specialists for help  with various needs.  Do not use hot tubs, steam rooms, or saunas.  Do not douche or use tampons or scented sanitary pads.  Do not cross your legs for long periods of time.  Avoid cat litter boxes and soil used by cats. These carry germs that can cause birth defects in the baby and possibly loss of the fetus by miscarriage or stillbirth.  Avoid all smoking, herbs, alcohol, and medicines not prescribed by your health care provider. Chemicals in these products affect the formation and growth of the baby.  Do not use any products that contain nicotine or tobacco, such as cigarettes and e-cigarettes. If you need help quitting, ask your health care provider. You may receive counseling support and other resources to help you quit.  Schedule a dentist appointment. At home, brush your teeth with a soft toothbrush and be gentle when you floss. Contact a health care provider if:  You have dizziness.  You have mild pelvic cramps, pelvic pressure, or nagging pain in the abdominal area.  You have persistent nausea, vomiting, or diarrhea.  You have a bad smelling vaginal discharge.  You have pain when you urinate.  You notice increased swelling in your face, hands, legs, or ankles.  You are exposed to fifth disease or chickenpox.  You are exposed to German measles (rubella) and have never had it. Get help right away if:  You have a fever.  You are leaking fluid from your vagina.  You have spotting or bleeding from your vagina.  You have severe abdominal cramping or pain.  You have rapid weight gain or loss.  You vomit blood or material that looks like coffee grounds.  You develop a severe headache.  You have shortness of breath.  You have any kind of trauma, such as from a fall or a car accident. Summary  The first trimester of pregnancy is from week 1 until the end of week 13 (months 1 through 3).  Your body goes through many changes during pregnancy. The changes vary from  woman to woman.  You will have routine prenatal visits. During those visits, your health care provider will examine you, discuss any test results you may have, and talk with you about how you are feeling. This information is not intended to replace advice given to you by your health care provider. Make sure you discuss any questions you have with your health care provider. Document Released: 03/15/2001 Document Revised: 03/02/2016 Document Reviewed: 03/02/2016 Elsevier Interactive Patient Education  2018 Elsevier   Inc.  

## 2017-10-05 LAB — RPR+RH+ABO+RUB AB+AB SCR+CB...
Antibody Screen: NEGATIVE
HEMATOCRIT: 41.2 % (ref 34.0–46.6)
HEMOGLOBIN: 13.6 g/dL (ref 11.1–15.9)
HIV SCREEN 4TH GENERATION: NONREACTIVE
Hepatitis B Surface Ag: NEGATIVE
MCH: 28.4 pg (ref 26.6–33.0)
MCHC: 33 g/dL (ref 31.5–35.7)
MCV: 86 fL (ref 79–97)
Platelets: 274 10*3/uL (ref 150–450)
RBC: 4.79 x10E6/uL (ref 3.77–5.28)
RDW: 14.1 % (ref 12.3–15.4)
RH TYPE: NEGATIVE
RPR: NONREACTIVE
RUBELLA: 4.3 {index} (ref >0.99)
Varicella zoster IgG: 706 index (ref >165)
WBC: 8.9 10*3/uL (ref 3.4–10.8)

## 2017-10-05 LAB — DRUG SCREEN, URINE
Amphetamines, Urine: NEGATIVE ng/mL
BARBITURATE SCREEN URINE: NEGATIVE ng/mL
Benzodiazepine Quant, Ur: NEGATIVE ng/mL
Cannabinoid Quant, Ur: NEGATIVE ng/mL
Cocaine (Metab.): NEGATIVE ng/mL
OPIATE QUANT UR: NEGATIVE ng/mL
PCP QUANT UR: NEGATIVE ng/mL

## 2017-10-06 LAB — URINE CULTURE: ORGANISM ID, BACTERIA: NO GROWTH

## 2017-10-09 ENCOUNTER — Ambulatory Visit (INDEPENDENT_AMBULATORY_CARE_PROVIDER_SITE_OTHER): Payer: BC Managed Care – PPO

## 2017-10-09 ENCOUNTER — Other Ambulatory Visit: Payer: Self-pay

## 2017-10-09 DIAGNOSIS — I371 Nonrheumatic pulmonary valve insufficiency: Secondary | ICD-10-CM | POA: Diagnosis not present

## 2017-10-10 LAB — CYTOLOGY - PAP
CHLAMYDIA, DNA PROBE: NEGATIVE
DIAGNOSIS: NEGATIVE
HPV: NOT DETECTED
NEISSERIA GONORRHEA: NEGATIVE
TRICH (WINDOWPATH): NEGATIVE

## 2017-10-13 ENCOUNTER — Ambulatory Visit (INDEPENDENT_AMBULATORY_CARE_PROVIDER_SITE_OTHER): Payer: BC Managed Care – PPO

## 2017-10-13 ENCOUNTER — Ambulatory Visit (INDEPENDENT_AMBULATORY_CARE_PROVIDER_SITE_OTHER): Payer: BC Managed Care – PPO | Admitting: Advanced Practice Midwife

## 2017-10-13 ENCOUNTER — Encounter: Payer: Self-pay | Admitting: Advanced Practice Midwife

## 2017-10-13 VITALS — BP 122/68 | Wt 140.0 lb

## 2017-10-13 DIAGNOSIS — Z3A11 11 weeks gestation of pregnancy: Secondary | ICD-10-CM

## 2017-10-13 DIAGNOSIS — O3680X0 Pregnancy with inconclusive fetal viability, not applicable or unspecified: Secondary | ICD-10-CM

## 2017-10-13 DIAGNOSIS — Z3A10 10 weeks gestation of pregnancy: Secondary | ICD-10-CM

## 2017-10-13 DIAGNOSIS — O0991 Supervision of high risk pregnancy, unspecified, first trimester: Secondary | ICD-10-CM

## 2017-10-13 DIAGNOSIS — Z8679 Personal history of other diseases of the circulatory system: Secondary | ICD-10-CM

## 2017-10-13 NOTE — Patient Instructions (Signed)

## 2017-10-13 NOTE — Progress Notes (Signed)
  Routine Prenatal Care Visit  Subjective  Kathleen Scott is a 32 y.o. G1P0 at 5822w4d being seen today for ongoing prenatal care.  She is currently monitored for the following issues for this high-risk pregnancy and has Pulmonary valve disease; H/O pulmonary valve stenosis; and High-risk pregnancy in first trimester on their problem list.  ----------------------------------------------------------------------------------- Patient reports no complaints.    .  .   . Denies leaking of fluid.  ----------------------------------------------------------------------------------- The following portions of the patient's history were reviewed and updated as appropriate: allergies, current medications, past family history, past medical history, past social history, past surgical history and problem list. Problem list updated.   Objective  Blood pressure 122/68, weight 140 lb (63.5 kg), last menstrual period 07/31/2017. Pregravid weight 133 lb (60.3 kg) Total Weight Gain 7 lb (3.175 kg) Urinalysis: Urine Protein: Negative Urine Glucose: Negative  Fetal Status: Fetal Heart Rate (bpm): 157         General:  Alert, oriented and cooperative. Patient is in no acute distress.  Skin: Skin is warm and dry. No rash noted.   Cardiovascular: Normal heart rate noted  Respiratory: Normal respiratory effort, no problems with respiration noted  Abdomen: Soft, gravid, appropriate for gestational age.       Pelvic:  Cervical exam deferred        Extremities: Normal range of motion.     Mental Status: Normal mood and affect. Normal behavior. Normal judgment and thought content.   Assessment   32 y.o. G1P0 at 2622w4d by  05/07/2018, by Last Menstrual Period presenting for routine prenatal visit  Plan   pregnancy 1 Problems (from 07/31/17 to present)    No problems associated with this episode.       Preterm labor symptoms and general obstetric precautions including but not limited to vaginal bleeding,  contractions, leaking of fluid and fetal movement were reviewed in detail with the patient. Please refer to After Visit Summary for other counseling recommendations.   Return in about 2 weeks (around 10/27/2017) for NT scan and rob.  Tresea MallJane Drishti Pepperman, CNM 10/13/2017 2:41 PM

## 2017-10-13 NOTE — Progress Notes (Signed)
Please change appt to me July 26

## 2017-10-16 ENCOUNTER — Telehealth: Payer: Self-pay | Admitting: Obstetrics & Gynecology

## 2017-10-16 NOTE — Telephone Encounter (Signed)
[facility]

## 2017-10-16 NOTE — Telephone Encounter (Signed)
-----   Message from Nadara Mustardobert P Harris, MD sent at 10/13/2017  4:57 PM EDT ----- Please change appt to me July 26

## 2017-10-20 ENCOUNTER — Telehealth: Payer: Self-pay | Admitting: Obstetrics & Gynecology

## 2017-10-20 NOTE — Telephone Encounter (Signed)
[facility] has been try to reach patient about her prescription [place] . Left multple message and has been about to reach patient.

## 2017-10-27 ENCOUNTER — Encounter: Payer: BC Managed Care – PPO | Admitting: Maternal Newborn

## 2017-10-27 ENCOUNTER — Ambulatory Visit (INDEPENDENT_AMBULATORY_CARE_PROVIDER_SITE_OTHER): Payer: BC Managed Care – PPO

## 2017-10-27 ENCOUNTER — Ambulatory Visit (INDEPENDENT_AMBULATORY_CARE_PROVIDER_SITE_OTHER): Payer: BC Managed Care – PPO | Admitting: Obstetrics & Gynecology

## 2017-10-27 VITALS — BP 100/60 | Wt 139.0 lb

## 2017-10-27 DIAGNOSIS — O0991 Supervision of high risk pregnancy, unspecified, first trimester: Secondary | ICD-10-CM

## 2017-10-27 DIAGNOSIS — Z3A12 12 weeks gestation of pregnancy: Secondary | ICD-10-CM | POA: Diagnosis not present

## 2017-10-27 DIAGNOSIS — Z3401 Encounter for supervision of normal first pregnancy, first trimester: Secondary | ICD-10-CM | POA: Diagnosis not present

## 2017-10-27 DIAGNOSIS — Z8679 Personal history of other diseases of the circulatory system: Secondary | ICD-10-CM

## 2017-10-27 NOTE — Patient Instructions (Signed)

## 2017-10-27 NOTE — Progress Notes (Signed)
  Subjective  Fetal Movement? no Contractions? no Leaking Fluid? no Vaginal Bleeding? no  Objective  BP 100/60   Wt 139 lb (63 kg)   LMP 07/31/2017   BMI 22.44 kg/m  General: NAD Pumonary: no increased work of breathing Abdomen: gravid, non-tender Extremities: no edema Psychiatric: mood appropriate, affect full  Assessment  32 y.o. G1P0 at 711w4d by  05/07/2018, by Last Menstrual Period presenting for routine prenatal visit  Plan   Problem List Items Addressed This Visit    None    Review of ULTRASOUND.    I have personally reviewed images and report of recent ultrasound [facility].    Plan of management to be discussed with patient. Indications:First Trimester Screen - NT Findings:  Singleton intrauterine pregnancy is visualized with a CRL consistent with 7245w6d  gestation, giving an (U/S) EDD of 05/05/2018. The (U/S) EDD is consistent with the clinically established Estimated Date of Delivery: 05/07/18.  FHR: 159 bpm CRL measurement: 66.3 mm NT measurement: 1.8 mm. Yolk sac and and early anatomy is normal. Amnion is visualized.  Nasal Bone is Present  Right Ovary is normal in appearance. Left Ovary is normal in appearance. Survey of the adnexa demonstrates no adnexal masses. There is no free peritoneal fluid in the cul de sac.  Impression: 1. 6045w6d viable Singleton Intrauterine pregnancy by U/S. 2. (U/S) EDD is consistent with Clinically established Estimated Date of Delivery: 05/07/18. 3. NT Screen is successfully completed.  Plan fetal ECHO 22 weeks (consult nv)  Annamarie MajorPaul Daley Gosse, MD, Merlinda FrederickFACOG [facility], [facility] 10/27/2017  2:38 PM

## 2017-10-31 LAB — FIRST TRIMESTER SCREEN W/NT
CRL: 66.3 mm
DIA MoM: 2.01
DIA Value: 493.7 pg/mL
GEST AGE-COLLECT: 12.7 wk
HCG VALUE: 161.4 [IU]/mL
Maternal Age At EDD: 32.9 yr
NUCHAL TRANSLUCENCY MOM: 1.09
NUMBER OF FETUSES: 1
Nuchal Translucency: 1.8 mm
PAPP-A MOM: 1.38
PAPP-A Value: 1518.1 ng/mL
TEST RESULTS: NEGATIVE
WEIGHT: 139 [lb_av]
hCG MoM: 1.7

## 2017-11-24 ENCOUNTER — Ambulatory Visit (INDEPENDENT_AMBULATORY_CARE_PROVIDER_SITE_OTHER): Payer: BC Managed Care – PPO | Admitting: Obstetrics & Gynecology

## 2017-11-24 VITALS — BP 120/80 | Wt 145.0 lb

## 2017-11-24 DIAGNOSIS — Z3A16 16 weeks gestation of pregnancy: Secondary | ICD-10-CM

## 2017-11-24 DIAGNOSIS — Z3492 Encounter for supervision of normal pregnancy, unspecified, second trimester: Secondary | ICD-10-CM

## 2017-11-24 DIAGNOSIS — Z3402 Encounter for supervision of normal first pregnancy, second trimester: Secondary | ICD-10-CM

## 2017-11-24 LAB — POCT URINALYSIS DIPSTICK OB
GLUCOSE, UA: NEGATIVE — AB
PROTEIN: NEGATIVE

## 2017-11-24 NOTE — Patient Instructions (Signed)

## 2017-11-24 NOTE — Progress Notes (Signed)
  Subjective  Fetal Movement? no Contractions? no Leaking Fluid? no Vaginal Bleeding? no Improved nausea Objective  BP 120/80   Wt 145 lb (65.8 kg)   LMP 07/31/2017   BMI 23.40 kg/m  General: NAD Pumonary: no increased work of breathing Abdomen: gravid, non-tender Extremities: no edema Psychiatric: mood appropriate, affect full  Assessment  32 y.o. G1P0 at 3671w4d by  05/07/2018, by Last Menstrual Period presenting for routine prenatal visit  Plan   Problem List Items Addressed This Visit      Other   Supervision of low-risk pregnancy, second trimester    Other Visit Diagnoses    [redacted] weeks gestation of pregnancy    -  Primary   Relevant Orders   POC Urinalysis Dipstick OB (Completed)   AMB referral to maternal fetal medicine    Echo 22 weeks US 20 weeks   Annamarie MajorPaul Quindarius Cabello, MD, Merlinda FrederickFACOG [facility], [facility] 11/24/2017  4:47 PM

## 2017-12-11 ENCOUNTER — Ambulatory Visit (INDEPENDENT_AMBULATORY_CARE_PROVIDER_SITE_OTHER): Payer: BC Managed Care – PPO | Admitting: Obstetrics & Gynecology

## 2017-12-11 VITALS — BP 120/70 | Wt 149.0 lb

## 2017-12-11 DIAGNOSIS — Z331 Pregnant state, incidental: Secondary | ICD-10-CM

## 2017-12-11 DIAGNOSIS — Z3A19 19 weeks gestation of pregnancy: Secondary | ICD-10-CM

## 2017-12-11 DIAGNOSIS — M79605 Pain in left leg: Secondary | ICD-10-CM

## 2017-12-11 DIAGNOSIS — R600 Localized edema: Secondary | ICD-10-CM

## 2017-12-11 LAB — POCT URINALYSIS DIPSTICK OB
Glucose, UA: NEGATIVE
POC,PROTEIN,UA: NEGATIVE

## 2017-12-11 NOTE — Progress Notes (Signed)
  Subjective  Edema this week worse, wanted to be seen    LLE knee to ankle, with knee pain (ant surface, no calf pain)    STress, heat, activity a factor.    No SOB Objective  BP 120/70   Wt 149 lb (67.6 kg)   LMP 07/31/2017   BMI 24.05 kg/m  General: NAD Pumonary: no increased work of breathing Abdomen: gravid, non-tender Extremities: no edema Psychiatric: mood appropriate, affect full UA- Neg prot and glc Assessment  32 y.o. G1P0 at [redacted]w[redacted]d by  05/07/2018, by Last Menstrual Period presenting for routine prenatal visit  Plan   Visit Diagnoses    EDEMA, Leg Pain [redacted] weeks gestation of pregnancy    -  Primary   Relevant Orders   POC Urinalysis Dipstick OB (Completed)    No s/sx edema today, no worry for preeclampsia, No cardiac concerns  Annamarie Major, MD, Merlinda Frederick Ob/Gyn, [facility] 12/11/2017  4:38 PM

## 2017-12-21 ENCOUNTER — Ambulatory Visit (INDEPENDENT_AMBULATORY_CARE_PROVIDER_SITE_OTHER): Payer: BC Managed Care – PPO

## 2017-12-21 ENCOUNTER — Ambulatory Visit (INDEPENDENT_AMBULATORY_CARE_PROVIDER_SITE_OTHER): Payer: BC Managed Care – PPO | Admitting: Obstetrics & Gynecology

## 2017-12-21 VITALS — BP 120/80 | Wt 150.0 lb

## 2017-12-21 DIAGNOSIS — Z3492 Encounter for supervision of normal pregnancy, unspecified, second trimester: Secondary | ICD-10-CM

## 2017-12-21 DIAGNOSIS — Z3A20 20 weeks gestation of pregnancy: Secondary | ICD-10-CM

## 2017-12-21 DIAGNOSIS — Z363 Encounter for antenatal screening for malformations: Secondary | ICD-10-CM

## 2017-12-21 DIAGNOSIS — Z3402 Encounter for supervision of normal first pregnancy, second trimester: Secondary | ICD-10-CM

## 2017-12-21 DIAGNOSIS — Z3A16 16 weeks gestation of pregnancy: Secondary | ICD-10-CM

## 2017-12-21 LAB — POCT URINALYSIS DIPSTICK OB
GLUCOSE, UA: NEGATIVE
POC,PROTEIN,UA: NEGATIVE

## 2017-12-21 NOTE — Patient Instructions (Signed)

## 2017-12-21 NOTE — Progress Notes (Addendum)
  HPI: No pain or bleeding, improved edema, no nausea.  20 weeks  pregnancy.  Ultrasound demonstrates singleton IUP with low lying placenta and 2 views incomplete; otherwise normal exam.  PMHx: She  has a past medical history of Congenital Pulmonic Stenosis, Heart murmur, and Syncope and collapse. Also,  has a past surgical history that includes open heart surgery., Family history is unknown by patient.,  reports that she has never smoked. She has never used smokeless tobacco. She reports that she drank alcohol. She reports that she does not use drugs.  She has a current medication list which includes the following prescription(s): doxylamine-pyridoxine er, ondansetron, and prenatal multivitamin. Also, has No Known Allergies.  Review of Systems  All other systems reviewed and are negative.   Objective: BP 120/80   Wt 150 lb (68 kg)   LMP 07/31/2017   BMI 24.21 kg/m   Physical examination Constitutional NAD, Conversant  Skin No rashes, lesions or ulceration.   Extremities: Moves all appropriately.  Normal ROM for age. No lymphadenopathy.  Neuro: Grossly intact  Psych: Oriented to PPT.  Normal mood. Normal affect.   Assessment:  [redacted] weeks gestation of pregnancy - Plan: POC Urinalysis Dipstick OB  Supervision of low-risk pregnancy, second trimester  Review of ULTRASOUND. I have personally reviewed images and report of [facility]. There is a singleton gestation with subjectively normal amniotic fluid volume. The fetal biometry correlates with established dating. Detailed evaluation of the fetal anatomy was performed.The fetal anatomical survey appears within normal limits within the resolution of ultrasound as described above.  It must be noted that a normal ultrasound is unable to rule out fetal aneuploidy.    ECHO 2 weeks; US follow up here 4 weeks    Counseled risks of low lying placenta if persists  Flu shot nv (unless provided at work)  Annamarie MajorPaul Muna Demers,  MD, Merlinda FrederickFACOG [facility],  [facility] 12/21/2017  4:31 PM

## 2018-01-09 ENCOUNTER — Ambulatory Visit: Payer: BC Managed Care – PPO | Admitting: Cardiovascular Disease

## 2018-01-09 ENCOUNTER — Encounter: Payer: Self-pay | Admitting: Cardiovascular Disease

## 2018-01-09 VITALS — BP 126/62 | HR 78 | Ht 66.0 in | Wt 156.5 lb

## 2018-01-09 DIAGNOSIS — I371 Nonrheumatic pulmonary valve insufficiency: Secondary | ICD-10-CM

## 2018-01-09 NOTE — Progress Notes (Signed)
ekg 

## 2018-01-09 NOTE — Patient Instructions (Signed)
Medication Instructions: Continue same medications.   Labwork: None.   Procedures/Testing: None.   Follow-Up: 3 months with Dr. Daniqua Campoy.   Any Additional Special Instructions Will Be Listed Below (If Applicable).     If you need a refill on your cardiac medications before your next appointment, please call your pharmacy.   

## 2018-01-09 NOTE — Progress Notes (Signed)
Cardiology Office Note   Date:  01/09/2018   ID:  Kathleen Scott, DOB 1986-02-14, MRN [id number]  PCP:  Emi Belfast, FNP  Cardiologist:   Lorine Bears, MD   Chief Complaint  Patient presents with  . other    3 mo f/u. medications reviewed verbally.       History of Present Illness: Kathleen Scott is a 32 y.o. female who presents for a follow up visit.  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  She has known moderate pulmonary valve regurgitation with mild RV enlargement.  She continues to work as a Runner, broadcasting/film/video. She is [redacted] weeks pregnant.  She has been doing extremely well with no chest pain, shortness of breath or palpitations.  She had an echocardiogram done in July which showed normal ejection fraction, the pulmonary valve was poorly visualized but there was moderate regurgitation with mildly dilated pulmonary artery and moderately dilated right ventricle.  Systolic function was mildly decreased.  Past Medical History:  Diagnosis Date  . Congenital Pulmonic Stenosis    a. 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 06/2015 Echo: EF 50-55%, septal wall HK 2/2 post-op state. Mild MR. Mildly dil RV size. Nl RV fxn. Triv PR. Nl PASP.  Marland Kitchen Heart murmur   . Syncope and collapse     Past Surgical History:  Procedure Laterality Date  . open heart surgery     [facility]     Current Outpatient Medications  Medication Sig Dispense Refill  . ondansetron (ZOFRAN ODT) 4 MG disintegrating tablet Take 1 tablet (4 mg total) by mouth every 6 (six) hours as needed for nausea. 20 tablet 0  . Prenatal Vit-Fe Fumarate-FA (PRENATAL MULTIVITAMIN) TABS tablet Take 1 tablet by mouth daily at 12 noon.     No current facility-administered medications for this visit.     Allergies:   Patient has no known allergies.    Social History:  The  patient  reports that she has never smoked. She has never used smokeless tobacco. She reports that she drank alcohol. She reports that she does not use drugs.   Family History:  The patient's Family history is unknown by patient.    ROS:  Please see the history of present illness.   Otherwise, review of systems are positive for none.   All other systems are reviewed and negative.    PHYSICAL EXAM: VS:  BP 126/62 (BP Location: Left Arm, Patient Position: Sitting, Cuff Size: Normal)   Pulse 78   Ht 5\' 6"  (1.676 m)   Wt 156 lb 8 oz (71 kg)   LMP 07/31/2017   BMI 25.26 kg/m  , BMI Body mass index is 25.26 kg/m. GEN: Well nourished, well developed, in no acute distress  HEENT: normal  Neck: no JVD, carotid bruits, or masses Cardiac: RRR; there is a 2/6 diastolic decrescendo murmur in the pulmonic area . Respiratory:  clear to auscultation bilaterally, normal work of breathing GI: soft, nontender, nondistended, + BS MS: no deformity or atrophy  Skin: warm and dry, no rash Neuro:  Strength and sensation are intact Psych: euthymic mood, full affect   EKG:  EKG is ordered today. The ekg ordered today demonstrates normal sinus rhythm with significant ST or T wave changes.   Recent Labs: 10/04/2017: Hemoglobin 13.6; Platelets 274    Lipid Panel No results  found for: CHOL, TRIG, HDL, CHOLHDL, VLDL, LDLCALC, LDLDIRECT    Wt Readings from Last 3 Encounters:  01/09/18 156 lb 8 oz (71 kg)  12/21/17 150 lb (68 kg)  12/11/17 149 lb (67.6 kg)        ASSESSMENT AND PLAN:  1.  Pulmonary valve disease: The patient has history of surgical repair of pulmonary valve stenosis. She is known to have moderate pulmonary valve regurgitation.  This should be well-tolerated during pregnancy.  The RV size seems to be getting bigger and the degree of regurgitation might be underestimated given that the valve itself was not well visualized. I am planning to obtain cardiac MRI after  delivery.   Disposition:   FU with me in 3 months  Signed,  Lorine Bears, MD  01/09/2018 4:45 PM    [facility]

## 2018-01-19 ENCOUNTER — Ambulatory Visit (INDEPENDENT_AMBULATORY_CARE_PROVIDER_SITE_OTHER): Payer: BC Managed Care – PPO

## 2018-01-19 ENCOUNTER — Ambulatory Visit (INDEPENDENT_AMBULATORY_CARE_PROVIDER_SITE_OTHER): Payer: BC Managed Care – PPO | Admitting: Obstetrics & Gynecology

## 2018-01-19 ENCOUNTER — Other Ambulatory Visit: Payer: Self-pay | Admitting: Obstetrics & Gynecology

## 2018-01-19 VITALS — BP 120/80 | Wt 156.0 lb

## 2018-01-19 DIAGNOSIS — Z362 Encounter for other antenatal screening follow-up: Secondary | ICD-10-CM

## 2018-01-19 DIAGNOSIS — Z3A24 24 weeks gestation of pregnancy: Secondary | ICD-10-CM

## 2018-01-19 DIAGNOSIS — Z3402 Encounter for supervision of normal first pregnancy, second trimester: Secondary | ICD-10-CM

## 2018-01-19 DIAGNOSIS — Z3492 Encounter for supervision of normal pregnancy, unspecified, second trimester: Secondary | ICD-10-CM

## 2018-01-19 LAB — POCT URINALYSIS DIPSTICK OB
GLUCOSE, UA: NEGATIVE
POC,PROTEIN,UA: NEGATIVE

## 2018-01-19 NOTE — Patient Instructions (Signed)
Back Pain in Pregnancy Back pain during pregnancy is common. Back pain may be caused by several factors that are related to changes during your pregnancy. Follow these instructions at home: Managing pain, stiffness, and swelling  If directed, apply ice for sudden (acute) back pain. ? Put ice in a plastic bag. ? Place a towel between your skin and the bag. ? Leave the ice on for 20 minutes, 2-3 times per day.  If directed, apply heat to the affected area before you exercise: ? Place a towel between your skin and the heat pack or heating pad. ? Leave the heat on for 20-30 minutes. ? Remove the heat if your skin turns bright red. This is especially important if you are unable to feel pain, heat, or cold. You may have a greater risk of getting burned. Activity  Exercise as told by your health care provider. Exercising is the best way to prevent or manage back pain.  Listen to your body when lifting. If lifting hurts, ask for help or bend your knees. This uses your leg muscles instead of your back muscles.  Squat down when picking up something from the floor. Do not bend over.  Only use bed rest as told by your health care provider. Bed rest should only be used for the most severe episodes of back pain. Standing, Sitting, and Lying Down  Do not stand in one place for long periods of time.  Use good posture when sitting. Make sure your head rests over your shoulders and is not hanging forward. Use a pillow on your lower back if necessary.  Try sleeping on your side, preferably the left side, with a pillow or two between your legs. If you are sore after a night's rest, your bed may be too soft. A firm mattress may provide more support for your back during pregnancy. General instructions  Do not wear high heels.  Eat a healthy diet. Try to gain weight within your health care provider's recommendations.  Use a maternity girdle, elastic sling, or back brace as told by your health care  provider.  Take over-the-counter and prescription medicines only as told by your health care provider.  Keep all follow-up visits as told by your health care provider. This is important. This includes any visits with any specialists, such as a physical therapist. Contact a health care provider if:  Your back pain interferes with your daily activities.  You have increasing pain in other parts of your body. Get help right away if:  You develop numbness, tingling, weakness, or problems with the use of your arms or legs.  You develop severe back pain that is not controlled with medicine.  You have a sudden change in bowel or bladder control.  You develop shortness of breath, dizziness, or you faint.  You develop nausea, vomiting, or sweating.  You have back pain that is a rhythmic, cramping pain similar to labor pains. Labor pain is usually 1-2 minutes apart, lasts for about 1 minute, and involves a bearing down feeling or pressure in your pelvis.  You have back pain and your water breaks or you have vaginal bleeding.  You have back pain or numbness that travels down your leg.  Your back pain developed after you fell.  You develop pain on one side of your back.  You see blood in your urine.  You develop skin blisters in the area of your back pain. This information is not intended to replace advice given to you   by your health care provider. Make sure you discuss any questions you have with your health care provider. Document Released: 06/29/2005 Document Revised: 08/27/2015 Document Reviewed: 12/03/2014 Elsevier Interactive Patient Education  2018 [employer]  

## 2018-01-19 NOTE — Progress Notes (Signed)
  Subjective  Fetal Movement? yes Contractions? no Leaking Fluid? no Vaginal Bleeding? no  Objective  BP 120/80   Wt 156 lb (70.8 kg)   LMP 07/31/2017   BMI 25.18 kg/m  General: NAD Pumonary: no increased work of breathing Abdomen: gravid, non-tender Extremities: no edema Psychiatric: mood appropriate, affect full  Assessment  32 y.o. G1P0 at [redacted]w[redacted]d by  05/07/2018, by Last Menstrual Period presenting for routine prenatal visit  Plan   Problem List Items Addressed This Visit      Other   Supervision of low-risk pregnancy, second trimester    Other Visit Diagnoses    [redacted] weeks gestation of pregnancy    -  Primary   Relevant Orders   POC Urinalysis Dipstick OB (Completed)   28 Weeks RH-Panel    Review of ULTRASOUND. I have personally reviewed images and report of recent ultrasound done at [facility]. There is a singleton gestation with subjectively normal amniotic fluid volume. The fetal biometry correlates with established dating. Detailed evaluation of the fetal anatomy was performed.The fetal anatomical survey appears within normal limits within the resolution of ultrasound as described above.  It must be noted that a normal ultrasound is unable to rule out fetal aneuploidy.    Breast feeding plans, Prog only pill plans  Annamarie Major, MD, Mer[facility] Frederick Ob/G[facility], [facility] 01/19/2018  4:26 PM

## 2018-02-16 ENCOUNTER — Other Ambulatory Visit: Payer: BC Managed Care – PPO

## 2018-02-16 ENCOUNTER — Ambulatory Visit (INDEPENDENT_AMBULATORY_CARE_PROVIDER_SITE_OTHER): Payer: BC Managed Care – PPO | Admitting: Obstetrics & Gynecology

## 2018-02-16 ENCOUNTER — Encounter: Payer: BC Managed Care – PPO | Admitting: Obstetrics & Gynecology

## 2018-02-16 VITALS — BP 124/60 | Wt 169.0 lb

## 2018-02-16 DIAGNOSIS — Z3492 Encounter for supervision of normal pregnancy, unspecified, second trimester: Secondary | ICD-10-CM

## 2018-02-16 DIAGNOSIS — Z3A28 28 weeks gestation of pregnancy: Secondary | ICD-10-CM

## 2018-02-16 DIAGNOSIS — Z8751 Personal history of pre-term labor: Secondary | ICD-10-CM

## 2018-02-16 DIAGNOSIS — Z3A24 24 weeks gestation of pregnancy: Secondary | ICD-10-CM

## 2018-02-16 LAB — POCT URINALYSIS DIPSTICK OB
Glucose, UA: NEGATIVE
POC,PROTEIN,UA: NEGATIVE

## 2018-02-16 NOTE — Progress Notes (Signed)
ROB- no concerns 

## 2018-02-16 NOTE — Patient Instructions (Signed)
Third Trimester of Pregnancy The third trimester is from week 28 through week 40 (months 7 through 9). The third trimester is a time when the unborn baby (fetus) is growing rapidly. At the end of the ninth month, the fetus is about 20 inches in length and weighs 6-10 pounds. Body changes during your third trimester Your body will continue to go through many changes during pregnancy. The changes vary from woman to woman. During the third trimester:  Your weight will continue to increase. You can expect to gain 25-35 pounds (11-16 kg) by the end of the pregnancy.  You may begin to get stretch marks on your hips, abdomen, and breasts.  You may urinate more often because the fetus is moving lower into your pelvis and pressing on your bladder.  You may develop or continue to have heartburn. This is caused by increased hormones that slow down muscles in the digestive tract.  You may develop or continue to have constipation because increased hormones slow digestion and cause the muscles that push waste through your intestines to relax.  You may develop hemorrhoids. These are swollen veins (varicose veins) in the rectum that can itch or be painful.  You may develop swollen, bulging veins (varicose veins) in your legs.  You may have increased body aches in the pelvis, back, or thighs. This is due to weight gain and increased hormones that are relaxing your joints.  You may have changes in your hair. These can include thickening of your hair, rapid growth, and changes in texture. Some women also have hair loss during or after pregnancy, or hair that feels dry or thin. Your hair will most likely return to normal after your baby is born.  Your breasts will continue to grow and they will continue to become tender. A yellow fluid (colostrum) may leak from your breasts. This is the first milk you are producing for your baby.  Your belly button may stick out.  You may notice more swelling in your hands,  face, or ankles.  You may have increased tingling or numbness in your hands, arms, and legs. The skin on your belly may also feel numb.  You may feel short of breath because of your expanding uterus.  You may have more problems sleeping. This can be caused by the size of your belly, increased need to urinate, and an increase in your body's metabolism.  You may notice the fetus "dropping," or moving lower in your abdomen (lightening).  You may have increased vaginal discharge.  You may notice your joints feel loose and you may have pain around your pelvic bone.  What to expect at prenatal visits You will have prenatal exams every 2 weeks until week 36. Then you will have weekly prenatal exams. During a routine prenatal visit:  You will be weighed to make sure you and the baby are growing normally.  Your blood pressure will be taken.  Your abdomen will be measured to track your baby's growth.  The fetal heartbeat will be listened to.  Any test results from the previous visit will be discussed.  You may have a cervical check near your due date to see if your cervix has softened or thinned (effaced).  You will be tested for Group B streptococcus. This happens between 35 and 37 weeks.  Your health care provider may ask you:  What your birth plan is.  How you are feeling.  If you are feeling the baby move.  If you have had   any abnormal symptoms, such as leaking fluid, bleeding, severe headaches, or abdominal cramping.  If you are using any tobacco products, including cigarettes, chewing tobacco, and electronic cigarettes.  If you have any questions.  Other tests or screenings that may be performed during your third trimester include:  Blood tests that check for low iron levels (anemia).  Fetal testing to check the health, activity level, and growth of the fetus. Testing is done if you have certain medical conditions or if there are problems during the  pregnancy.  Nonstress test (NST). This test checks the health of your baby to make sure there are no signs of problems, such as the baby not getting enough oxygen. During this test, a belt is placed around your belly. The baby is made to move, and its heart rate is monitored during movement.  What is false labor? False labor is a condition in which you feel small, irregular tightenings of the muscles in the womb (contractions) that usually go away with rest, changing position, or drinking water. These are called Braxton Hicks contractions. Contractions may last for hours, days, or even weeks before true labor sets in. If contractions come at regular intervals, become more frequent, increase in intensity, or become painful, you should see your health care provider. What are the signs of labor?  Abdominal cramps.  Regular contractions that start at 10 minutes apart and become stronger and more frequent with time.  Contractions that start on the top of the uterus and spread down to the lower abdomen and back.  Increased pelvic pressure and dull back pain.  A watery or bloody mucus discharge that comes from the vagina.  Leaking of amniotic fluid. This is also known as your "water breaking." It could be a slow trickle or a gush. Let your health care provider know if it has a color or strange odor. If you have any of these signs, call your health care provider right away, even if it is before your due date. Follow these instructions at home: Medicines  Follow your health care provider's instructions regarding medicine use. Specific medicines may be either safe or unsafe to take during pregnancy.  Take a prenatal vitamin that contains at least 600 micrograms (mcg) of folic acid.  If you develop constipation, try taking a stool softener if your health care provider approves. Eating and drinking  Eat a balanced diet that includes fresh fruits and vegetables, whole grains, good sources of protein  such as meat, eggs, or tofu, and low-fat dairy. Your health care provider will help you determine the amount of weight gain that is right for you.  Avoid raw meat and uncooked cheese. These carry germs that can cause birth defects in the baby.  If you have low calcium intake from food, talk to your health care provider about whether you should take a daily calcium supplement.  Eat four or five small meals rather than three large meals a day.  Limit foods that are high in fat and processed sugars, such as fried and sweet foods.  To prevent constipation: ? Drink enough fluid to keep your urine clear or pale yellow. ? Eat foods that are high in fiber, such as fresh fruits and vegetables, whole grains, and beans. Activity  Exercise only as directed by your health care provider. Most women can continue their usual exercise routine during pregnancy. Try to exercise for 30 minutes at least 5 days a week. Stop exercising if you experience uterine contractions.  Avoid heavy   lifting.  Do not exercise in extreme heat or humidity, or at high altitudes.  Wear low-heel, comfortable shoes.  Practice good posture.  You may continue to have sex unless your health care provider tells you otherwise. Relieving pain and discomfort  Take frequent breaks and rest with your legs elevated if you have leg cramps or low back pain.  Take warm sitz baths to soothe any pain or discomfort caused by hemorrhoids. Use hemorrhoid cream if your health care provider approves.  Wear a good support bra to prevent discomfort from breast tenderness.  If you develop varicose veins: ? Wear support pantyhose or compression stockings as told by your healthcare provider. ? Elevate your feet for 15 minutes, 3-4 times a day. Prenatal care  Write down your questions. Take them to your prenatal visits.  Keep all your prenatal visits as told by your health care provider. This is important. Safety  Wear your seat belt at  all times when driving.  Make a list of emergency phone numbers, including numbers for family, friends, the hospital, and police and fire departments. General instructions  Avoid cat litter boxes and soil used by cats. These carry germs that can cause birth defects in the baby. If you have a cat, ask someone to clean the litter box for you.  Do not travel far distances unless it is absolutely necessary and only with the approval of your health care provider.  Do not use hot tubs, steam rooms, or saunas.  Do not drink alcohol.  Do not use any products that contain nicotine or tobacco, such as cigarettes and e-cigarettes. If you need help quitting, ask your health care provider.  Do not use any medicinal herbs or unprescribed drugs. These chemicals affect the formation and growth of the baby.  Do not douche or use tampons or scented sanitary pads.  Do not cross your legs for long periods of time.  To prepare for the arrival of your baby: ? Take prenatal classes to understand, practice, and ask questions about labor and delivery. ? Make a trial run to the hospital. ? Visit the hospital and tour the maternity area. ? Arrange for maternity or paternity leave through employers. ? Arrange for family and friends to take care of pets while you are in the hospital. ? Purchase a rear-facing car seat and make sure you know how to install it in your car. ? Pack your hospital bag. ? Prepare the baby's nursery. Make sure to remove all pillows and stuffed animals from the baby's crib to prevent suffocation.  Visit your dentist if you have not gone during your pregnancy. Use a soft toothbrush to brush your teeth and be gentle when you floss. Contact a health care provider if:  You are unsure if you are in labor or if your water has broken.  You become dizzy.  You have mild pelvic cramps, pelvic pressure, or nagging pain in your abdominal area.  You have lower back pain.  You have persistent  nausea, vomiting, or diarrhea.  You have an unusual or bad smelling vaginal discharge.  You have pain when you urinate. Get help right away if:  Your water breaks before 37 weeks.  You have regular contractions less than 5 minutes apart before 37 weeks.  You have a fever.  You are leaking fluid from your vagina.  You have spotting or bleeding from your vagina.  You have severe abdominal pain or cramping.  You have rapid weight loss or weight gain.    You have shortness of breath with chest pain.  You notice sudden or extreme swelling of your face, hands, ankles, feet, or legs.  Your baby makes fewer than 10 movements in 2 hours.  You have severe headaches that do not go away when you take medicine.  You have vision changes. Summary  The third trimester is from week 28 through week 40, months 7 through 9. The third trimester is a time when the unborn baby (fetus) is growing rapidly.  During the third trimester, your discomfort may increase as you and your baby continue to gain weight. You may have abdominal, leg, and back pain, sleeping problems, and an increased need to urinate.  During the third trimester your breasts will keep growing and they will continue to become tender. A yellow fluid (colostrum) may leak from your breasts. This is the first milk you are producing for your baby.  False labor is a condition in which you feel small, irregular tightenings of the muscles in the womb (contractions) that eventually go away. These are called Braxton Hicks contractions. Contractions may last for hours, days, or even weeks before true labor sets in.  Signs of labor can include: abdominal cramps; regular contractions that start at 10 minutes apart and become stronger and more frequent with time; watery or bloody mucus discharge that comes from the vagina; increased pelvic pressure and dull back pain; and leaking of amniotic fluid. This information is not intended to replace advice  given to you by your health care provider. Make sure you discuss any questions you have with your health care provider. Document Released: 03/15/2001 Document Revised: 08/27/2015 Document Reviewed: 05/22/2012 Elsevier Interactive Patient Education  2017 [employer]  

## 2018-02-16 NOTE — Addendum Note (Signed)
Addended by: Donnetta HailARROYO, Jaiveon Suppes on: 02/16/2018 03:25 PM   Modules accepted: Orders

## 2018-02-16 NOTE — Progress Notes (Signed)
  Subjective  Fetal Movement? yes Contractions? no Leaking Fluid? no Vaginal Bleeding? no  Objective  BP 124/60   Wt 169 lb (76.7 kg)   LMP 07/31/2017   BMI 27.28 kg/m  General: NAD Pumonary: no increased work of breathing Abdomen: gravid, non-tender Extremities: no edema Psychiatric: mood appropriate, affect full  Assessment  32 y.o. G1P0 at 3363w4d by  05/07/2018, by Last Menstrual Period presenting for routine prenatal visit  Plan   Problem List Items Addressed This Visit      Other   Supervision of low-risk pregnancy, second trimester - Primary    Other Visit Diagnoses    [redacted] weeks gestation of pregnancy        PNV, FMC, Labs today  Annamarie MajorPaul Trishia Cuthrell, MD, Merlinda FrederickFACOG [facility], [facility] 02/16/2018  3:21 PM

## 2018-02-16 NOTE — Addendum Note (Signed)
Addended by: Donnetta HailARROYO, Neziah Braley on: 02/16/2018 04:10 PM   Modules accepted: Orders

## 2018-02-17 LAB — 28 WEEKS RH-PANEL
Antibody Screen: NEGATIVE
BASOS ABS: 0 10*3/uL (ref 0.0–0.2)
Basos: 0 %
EOS (ABSOLUTE): 0.3 10*3/uL (ref 0.0–0.4)
Eos: 2 %
Gestational Diabetes Screen: 108 mg/dL (ref 65–139)
HEMATOCRIT: 27.4 % — AB (ref 34.0–46.6)
HEMOGLOBIN: 9.4 g/dL — AB (ref 11.1–15.9)
HIV Screen 4th Generation wRfx: NONREACTIVE
Immature Grans (Abs): 0.1 10*3/uL (ref 0.0–0.1)
Immature Granulocytes: 1 %
LYMPHS ABS: 1.8 10*3/uL (ref 0.7–3.1)
Lymphs: 16 %
MCH: 28.9 pg (ref 26.6–33.0)
MCHC: 34.3 g/dL (ref 31.5–35.7)
MCV: 84 fL (ref 79–97)
MONOS ABS: 1 10*3/uL — AB (ref 0.1–0.9)
Monocytes: 9 %
Neutrophils Absolute: 7.9 10*3/uL — ABNORMAL HIGH (ref 1.4–7.0)
Neutrophils: 72 %
PLATELETS: 246 10*3/uL (ref 150–450)
RBC: 3.25 x10E6/uL — ABNORMAL LOW (ref 3.77–5.28)
RDW: 12.9 % (ref 12.3–15.4)
RPR Ser Ql: NONREACTIVE
WBC: 11.1 10*3/uL — ABNORMAL HIGH (ref 3.4–10.8)

## 2018-03-05 ENCOUNTER — Encounter: Payer: Self-pay | Admitting: Obstetrics & Gynecology

## 2018-03-05 ENCOUNTER — Ambulatory Visit (INDEPENDENT_AMBULATORY_CARE_PROVIDER_SITE_OTHER): Payer: BC Managed Care – PPO | Admitting: Obstetrics & Gynecology

## 2018-03-05 VITALS — BP 120/80 | Wt 168.0 lb

## 2018-03-05 DIAGNOSIS — Z3A31 31 weeks gestation of pregnancy: Secondary | ICD-10-CM

## 2018-03-05 DIAGNOSIS — Z23 Encounter for immunization: Secondary | ICD-10-CM | POA: Diagnosis not present

## 2018-03-05 DIAGNOSIS — Z8679 Personal history of other diseases of the circulatory system: Secondary | ICD-10-CM

## 2018-03-05 DIAGNOSIS — Z3403 Encounter for supervision of normal first pregnancy, third trimester: Secondary | ICD-10-CM

## 2018-03-05 DIAGNOSIS — Z3492 Encounter for supervision of normal pregnancy, unspecified, second trimester: Secondary | ICD-10-CM

## 2018-03-05 LAB — POCT URINALYSIS DIPSTICK OB
Glucose, UA: NEGATIVE
PROTEIN: NEGATIVE

## 2018-03-05 NOTE — Progress Notes (Signed)
  Subjective  Fetal Movement? yes Contractions? no Leaking Fluid? no Vaginal Bleeding? no  Objective  BP 120/80   Wt 168 lb (76.2 kg)   LMP 07/31/2017   BMI 27.12 kg/m  General: NAD Pumonary: no increased work of breathing Abdomen: gravid, non-tender Extremities: no edema Psychiatric: mood appropriate, affect full  Assessment  32 y.o. G1P0 at 4379w0d by  05/07/2018, by Last Menstrual Period presenting for routine prenatal visit  Plan   Problem List Items Addressed This Visit      Other   H/O pulmonary valve stenosis   Supervision of low-risk pregnancy, second trimester - Primary    Other Visit Diagnoses    [redacted] weeks gestation of pregnancy        TDaP  Annamarie MajorPaul Feliciana Narayan, MD, Merlinda FrederickFACOG [facility], [facility] 03/05/2018  4:39 PM

## 2018-03-05 NOTE — Addendum Note (Signed)
Addended by: Cornelius MorasPATTERSON, Derrico Zhong D on: 03/05/2018 04:44 PM   Modules accepted: Orders

## 2018-03-05 NOTE — Addendum Note (Signed)
Addended by: Cornelius MorasPATTERSON, Antonela Freiman D on: 03/05/2018 04:51 PM   Modules accepted: Orders

## 2018-03-14 ENCOUNTER — Encounter: Payer: Self-pay | Admitting: Obstetrics & Gynecology

## 2018-03-14 NOTE — Telephone Encounter (Signed)
Can you ask SDJ about her concerns, and I can let the pt know

## 2018-03-15 NOTE — Telephone Encounter (Signed)
Please advise since RPH is not in the office

## 2018-03-16 ENCOUNTER — Encounter: Payer: BC Managed Care – PPO | Admitting: Advanced Practice Midwife

## 2018-03-21 ENCOUNTER — Ambulatory Visit (INDEPENDENT_AMBULATORY_CARE_PROVIDER_SITE_OTHER): Payer: BC Managed Care – PPO | Admitting: Obstetrics & Gynecology

## 2018-03-21 VITALS — BP 120/60 | Wt 174.0 lb

## 2018-03-21 DIAGNOSIS — Z3A33 33 weeks gestation of pregnancy: Secondary | ICD-10-CM

## 2018-03-21 DIAGNOSIS — Z3493 Encounter for supervision of normal pregnancy, unspecified, third trimester: Secondary | ICD-10-CM

## 2018-03-21 DIAGNOSIS — Z3492 Encounter for supervision of normal pregnancy, unspecified, second trimester: Secondary | ICD-10-CM

## 2018-03-21 LAB — POCT URINALYSIS DIPSTICK OB

## 2018-03-21 NOTE — Progress Notes (Signed)
  Subjective  Fetal Movement? yes Contractions? no Leaking Fluid? no Vaginal Bleeding? no  Objective  BP 120/60   Wt 174 lb (78.9 kg)   LMP 07/31/2017   BMI 28.08 kg/m  General: NAD Pumonary: no increased work of breathing Abdomen: gravid, non-tender Extremities: no edema Psychiatric: mood appropriate, affect full  Assessment  32 y.o. G1P0 at 7343w2d by  05/07/2018, by Last Menstrual Period presenting for routine prenatal visit  Plan   Problem List Items Addressed This Visit      Other   Supervision of low-risk pregnancy, second trimester    Other Visit Diagnoses    [redacted] weeks gestation of pregnancy    -  Primary    PNV, FMC  Annamarie MajorPaul Jaquin Coy, MD, Merlinda FrederickFACOG [facility], [facility] 03/21/2018  4:05 PM

## 2018-03-21 NOTE — Addendum Note (Signed)
Addended by: Cornelius MorasPATTERSON, Shunda Rabadi D on: 03/21/2018 04:11 PM   Modules accepted: Orders

## 2018-04-12 ENCOUNTER — Ambulatory Visit (INDEPENDENT_AMBULATORY_CARE_PROVIDER_SITE_OTHER): Payer: BC Managed Care – PPO | Admitting: Obstetrics & Gynecology

## 2018-04-12 VITALS — BP 100/70 | Wt 174.0 lb

## 2018-04-12 DIAGNOSIS — Z3492 Encounter for supervision of normal pregnancy, unspecified, second trimester: Secondary | ICD-10-CM

## 2018-04-12 DIAGNOSIS — Z8679 Personal history of other diseases of the circulatory system: Secondary | ICD-10-CM

## 2018-04-12 DIAGNOSIS — Z3493 Encounter for supervision of normal pregnancy, unspecified, third trimester: Secondary | ICD-10-CM

## 2018-04-12 DIAGNOSIS — Z3A36 36 weeks gestation of pregnancy: Secondary | ICD-10-CM

## 2018-04-12 LAB — POCT URINALYSIS DIPSTICK OB
Glucose, UA: NEGATIVE
POC,PROTEIN,UA: NEGATIVE

## 2018-04-12 LAB — OB RESULTS CONSOLE GBS: GBS: POSITIVE

## 2018-04-12 NOTE — Progress Notes (Signed)
  Subjective  Fetal Movement? yes Contractions? no Leaking Fluid? no Vaginal Bleeding? no  Objective  BP 100/70   Wt 174 lb (78.9 kg)   LMP 07/31/2017   BMI 28.08 kg/m  General: NAD Pumonary: no increased work of breathing Abdomen: gravid, non-tender Extremities: no edema Psychiatric: mood appropriate, affect full  Assessment  33 y.o. G1P0 at [redacted]w[redacted]d by  05/07/2018, by Last Menstrual Period presenting for routine prenatal visit  Plan   Problem List Items Addressed This Visit      Other   H/O pulmonary valve stenosis   Supervision of low-risk pregnancy, second trimester    Other Visit Diagnoses    [redacted] weeks gestation of pregnancy    -  Primary   Relevant Orders   Culture, beta strep (group b only)   POC Urinalysis Dipstick OB (Completed)    PNV, FMC  Annamarie Major, MD, FACOG [facility], [facility] 04/12/2018  4:28 PM

## 2018-04-15 LAB — CULTURE, BETA STREP (GROUP B ONLY): Strep Gp B Culture: POSITIVE — AB

## 2018-04-19 ENCOUNTER — Ambulatory Visit (INDEPENDENT_AMBULATORY_CARE_PROVIDER_SITE_OTHER): Payer: BC Managed Care – PPO | Admitting: Obstetrics & Gynecology

## 2018-04-19 ENCOUNTER — Encounter: Payer: Self-pay | Admitting: Obstetrics & Gynecology

## 2018-04-19 VITALS — BP 110/70 | Wt 175.0 lb

## 2018-04-19 DIAGNOSIS — Z3A37 37 weeks gestation of pregnancy: Secondary | ICD-10-CM

## 2018-04-19 DIAGNOSIS — Z3492 Encounter for supervision of normal pregnancy, unspecified, second trimester: Secondary | ICD-10-CM

## 2018-04-19 DIAGNOSIS — Z3403 Encounter for supervision of normal first pregnancy, third trimester: Secondary | ICD-10-CM

## 2018-04-19 DIAGNOSIS — Z8679 Personal history of other diseases of the circulatory system: Secondary | ICD-10-CM

## 2018-04-19 LAB — POCT URINALYSIS DIPSTICK OB: Glucose, UA: NEGATIVE

## 2018-04-19 NOTE — Progress Notes (Signed)
  Subjective  Fetal Movement? yes Contractions? no Leaking Fluid? no Vaginal Bleeding? no  Objective  BP 110/70   Wt 175 lb (79.4 kg)   LMP 07/31/2017   BMI 28.25 kg/m  General: NAD Pumonary: no increased work of breathing Abdomen: gravid, non-tender Extremities: no edema Psychiatric: mood appropriate, affect full  Assessment  33 y.o. G1P0 at [redacted]w[redacted]d by  05/07/2018, by Last Menstrual Period presenting for routine prenatal visit  Plan   Problem List Items Addressed This Visit      Other   H/O pulmonary valve stenosis   Supervision of low-risk pregnancy, second trimester    Other Visit Diagnoses    [redacted] weeks gestation of pregnancy    -  Primary    No new concerns GBS POS discussed  Annamarie Major, MD, [facility] 04/19/2018  4:37 PM

## 2018-04-19 NOTE — Addendum Note (Signed)
Addended by: Cornelius Moras D on: 04/19/2018 04:49 PM   Modules accepted: Orders

## 2018-04-25 ENCOUNTER — Ambulatory Visit (INDEPENDENT_AMBULATORY_CARE_PROVIDER_SITE_OTHER): Payer: BC Managed Care – PPO | Admitting: Obstetrics & Gynecology

## 2018-04-25 VITALS — BP 120/80 | Wt 178.0 lb

## 2018-04-25 DIAGNOSIS — Z3403 Encounter for supervision of normal first pregnancy, third trimester: Secondary | ICD-10-CM

## 2018-04-25 DIAGNOSIS — Z8679 Personal history of other diseases of the circulatory system: Secondary | ICD-10-CM

## 2018-04-25 DIAGNOSIS — Z3A38 38 weeks gestation of pregnancy: Secondary | ICD-10-CM

## 2018-04-25 DIAGNOSIS — Z3492 Encounter for supervision of normal pregnancy, unspecified, second trimester: Secondary | ICD-10-CM

## 2018-04-25 LAB — POCT URINALYSIS DIPSTICK OB: Glucose, UA: NEGATIVE

## 2018-04-25 NOTE — Progress Notes (Signed)
  Subjective  Fetal Movement? yes Contractions? Yes- intermittent Leaking Fluid? no Vaginal Bleeding? no  Objective  BP 120/80   Wt 178 lb (80.7 kg)   LMP 07/31/2017   BMI 28.73 kg/m  General: NAD Pumonary: no increased work of breathing Abdomen: gravid, non-tender Extremities: no edema Psychiatric: mood appropriate, affect full  Assessment  33 y.o. G1P0 at [redacted]w[redacted]d by  05/07/2018, by Last Menstrual Period presenting for routine prenatal visit  Plan   Problem List Items Addressed This Visit      Other   H/O pulmonary valve stenosis   Supervision of low-risk pregnancy, second trimester    Other Visit Diagnoses    [redacted] weeks gestation of pregnancy    -  Primary   Relevant Orders   POC Urinalysis Dipstick OB (Completed)    Discussed cervical exam and planning for IOL nv    (tentative 05/05/2018) PNV, FMC, Labor precautions discussed  Annamarie Major, MD, [facility], [facility] 04/25/2018  4:40 PM

## 2018-05-01 ENCOUNTER — Ambulatory Visit (INDEPENDENT_AMBULATORY_CARE_PROVIDER_SITE_OTHER): Payer: BC Managed Care – PPO | Admitting: Obstetrics & Gynecology

## 2018-05-01 VITALS — BP 120/80 | Wt 180.0 lb

## 2018-05-01 DIAGNOSIS — Z3492 Encounter for supervision of normal pregnancy, unspecified, second trimester: Secondary | ICD-10-CM

## 2018-05-01 DIAGNOSIS — Z8679 Personal history of other diseases of the circulatory system: Secondary | ICD-10-CM

## 2018-05-01 DIAGNOSIS — Z3403 Encounter for supervision of normal first pregnancy, third trimester: Secondary | ICD-10-CM

## 2018-05-01 DIAGNOSIS — Z3A39 39 weeks gestation of pregnancy: Secondary | ICD-10-CM

## 2018-05-01 LAB — POCT URINALYSIS DIPSTICK OB: Glucose, UA: NEGATIVE

## 2018-05-01 NOTE — Progress Notes (Signed)
History and Physical  Benn MoulderMorgan L Mathieson is a 33 y.o. G1P0 5831w1d  for Induction of Labor scheduled due to Favorable cervix at term .   See labor record for pregnancy highlights.  No recent pain, bleeding, ruptured membranes, or other signs of progressing labor.  PMHx: She  has a past medical history of Congenital Pulmonic Stenosis, Heart murmur, and Syncope and collapse. Also,  has a past surgical history that includes open heart surgery., Family history is unknown by patient.,  reports that she has never smoked. She has never used smokeless tobacco. She reports previous alcohol use. She reports that she does not use drugs. She has a current medication list which includes the following prescription(s): ondansetron and prenatal multivitamin. Also, has No Known Allergies. OB History  Gravida Para Term Preterm AB Living  1            SAB TAB Ectopic Multiple Live Births               # Outcome Date GA Lbr Len/2nd Weight Sex Delivery Anes PTL Lv  1 Current           Patient denies any other pertinent gynecologic issues.   Review of Systems  All other systems reviewed and are negative.  Objective: BP 120/80   Wt 180 lb (81.6 kg)   LMP 07/31/2017   BMI 29.05 kg/m  Physical Exam Constitutional:      General: She is not in acute distress.    Appearance: She is well-developed.  Genitourinary:     Pelvic exam was performed with patient supine.     Vagina and uterus normal.     No vaginal erythema or bleeding.     No cervical motion tenderness, discharge, polyp or nabothian cyst.     Uterus is mobile.     Uterus is not enlarged.     No uterine mass detected.    Uterus is midaxial.     No right or left adnexal mass present.     Right adnexa not tender.     Left adnexa not tender.     Genitourinary Comments: Cx 1/60/-2, Vtx  HENT:     Head: Normocephalic and atraumatic.     Nose: Nose normal.  Abdominal:     General: There is no distension.     Palpations: Abdomen is soft.   Tenderness: There is no abdominal tenderness.  Musculoskeletal: Normal range of motion.  Neurological:     Mental Status: She is alert and oriented to person, place, and time.     Cranial Nerves: No cranial nerve deficit.  Skin:    General: Skin is warm and dry.   Assessment: Term Pregnancy for Induction of Labor due to Favorable cervix at term. Also, 1. [redacted] weeks gestation of pregnancy 2. Supervision of low-risk pregnancy, second trimester 3. H/O pulmonary valve stenosis  Plan: Patient will undergo induction of labor with cervical ripening agents.     Patient has been fully informed of the pros and cons, risks and benefits of continued observation with fetal monitoring versus that of induction of labor.   She understands that there are uncommon risks to induction, which include but are not limited to : frequent or prolonged uterine contractions, fetal distress, uterine rupture, and lack of successful induction.  These risks include all methods including Pitocin and Misoprostol and Cervadil.  Patient understands that using Misoprostol for labor induction is an "off label" indication although it has been studied extensively for this purpose  and is an accepted method of induction.  She also has been informed of the increased risks for Cesarean with induction and should induction not be successful.  Patient consents to the induction plan of management.  Plans to breast feed Plans oral progesterone-only contraceptive for contraception TDaP UTD GBS POS, plan ABX in labor  Annamarie MajorPaul Loza Prell, MD, Merlinda FrederickFACOG [facility], [facility] 05/01/2018  3:21 PM

## 2018-05-05 ENCOUNTER — Inpatient Hospital Stay: Payer: BC Managed Care – PPO | Admitting: Anesthesiology

## 2018-05-05 ENCOUNTER — Other Ambulatory Visit: Payer: Self-pay

## 2018-05-05 ENCOUNTER — Inpatient Hospital Stay
Admission: EM | Admit: 2018-05-05 | Discharge: 2018-05-09 | DRG: 787 | Disposition: A | Discharge status: Home / Self Care | Payer: BC Managed Care – PPO | Attending: Obstetrics & Gynecology | Admitting: Obstetrics & Gynecology

## 2018-05-05 DIAGNOSIS — O321XX0 Maternal care for breech presentation, not applicable or unspecified: Secondary | ICD-10-CM | POA: Diagnosis present

## 2018-05-05 DIAGNOSIS — O9089 Other complications of the puerperium, not elsewhere classified: Secondary | ICD-10-CM | POA: Diagnosis not present

## 2018-05-05 DIAGNOSIS — O99820 Streptococcus B carrier state complicating pregnancy: Secondary | ICD-10-CM

## 2018-05-05 DIAGNOSIS — O9081 Anemia of the puerperium: Secondary | ICD-10-CM | POA: Diagnosis not present

## 2018-05-05 DIAGNOSIS — D62 Acute posthemorrhagic anemia: Secondary | ICD-10-CM | POA: Diagnosis not present

## 2018-05-05 DIAGNOSIS — Z3A39 39 weeks gestation of pregnancy: Secondary | ICD-10-CM

## 2018-05-05 DIAGNOSIS — Z3490 Encounter for supervision of normal pregnancy, unspecified, unspecified trimester: Secondary | ICD-10-CM

## 2018-05-05 DIAGNOSIS — O26893 Other specified pregnancy related conditions, third trimester: Secondary | ICD-10-CM | POA: Diagnosis present

## 2018-05-05 DIAGNOSIS — O99824 Streptococcus B carrier state complicating childbirth: Secondary | ICD-10-CM | POA: Diagnosis not present

## 2018-05-05 DIAGNOSIS — M25511 Pain in right shoulder: Secondary | ICD-10-CM | POA: Diagnosis not present

## 2018-05-05 LAB — CBC
HCT: 27.1 % — ABNORMAL LOW (ref 36.0–46.0)
Hemoglobin: 8.5 g/dL — ABNORMAL LOW (ref 12.0–15.0)
MCH: 25.8 pg — ABNORMAL LOW (ref 26.0–34.0)
MCHC: 31.4 g/dL (ref 30.0–36.0)
MCV: 82.4 fL (ref 80.0–100.0)
Platelets: 242 10*3/uL (ref 150–400)
RBC: 3.29 MIL/uL — ABNORMAL LOW (ref 3.87–5.11)
RDW: 14.7 % (ref 11.5–15.5)
WBC: 8.7 10*3/uL (ref 4.0–10.5)
nRBC: 0 % (ref 0.0–0.2)

## 2018-05-05 LAB — COMPREHENSIVE METABOLIC PANEL
ALBUMIN: 2.6 g/dL — AB (ref 3.5–5.0)
ALT: 12 U/L (ref 0–44)
AST: 24 U/L (ref 15–41)
Alkaline Phosphatase: 137 U/L — ABNORMAL HIGH (ref 38–126)
Anion gap: 7 (ref 5–15)
BUN: 12 mg/dL (ref 6–20)
CHLORIDE: 108 mmol/L (ref 98–111)
CO2: 21 mmol/L — ABNORMAL LOW (ref 22–32)
Calcium: 8.4 mg/dL — ABNORMAL LOW (ref 8.9–10.3)
Creatinine, Ser: 0.58 mg/dL (ref 0.44–1.00)
GFR calc Af Amer: >60 mL/min (ref >60)
GFR calc non Af Amer: >60 mL/min (ref >60)
Glucose, Bld: 96 mg/dL (ref 70–99)
Potassium: 4 mmol/L (ref 3.5–5.1)
Sodium: 136 mmol/L (ref 135–145)
Total Bilirubin: 0.5 mg/dL (ref 0.3–1.2)
Total Protein: 6.4 g/dL — ABNORMAL LOW (ref 6.5–8.1)

## 2018-05-05 LAB — PROTEIN / CREATININE RATIO, URINE
Creatinine, Urine: 43 mg/dL
PROTEIN CREATININE RATIO: 0.21 mg/mg{creat} — AB (ref 0.00–0.15)
Total Protein, Urine: 9 mg/dL

## 2018-05-05 LAB — ABO/RH: ABO/RH(D): A NEG

## 2018-05-05 MED ORDER — MISOPROSTOL 200 MCG PO TABS
ORAL_TABLET | ORAL | Status: AC
  Filled 2018-05-05: qty 4

## 2018-05-05 MED ORDER — TERBUTALINE SULFATE 1 MG/ML IJ SOLN: 0.2500 mg | Freq: Once | INTRAMUSCULAR | Status: DC | PRN

## 2018-05-05 MED ORDER — OXYTOCIN 40 UNITS IN NORMAL SALINE INFUSION - SIMPLE MED
2.5000 [IU]/h | INTRAVENOUS | Status: DC
  Administered 2018-05-06: 1000 mL via INTRAVENOUS
  Filled 2018-05-05 (×2): qty 1000

## 2018-05-05 MED ORDER — PHENYLEPHRINE 40 MCG/ML (10ML) SYRINGE FOR IV PUSH (FOR BLOOD PRESSURE SUPPORT): 80.0000 ug | PREFILLED_SYRINGE | INTRAVENOUS | Status: DC | PRN

## 2018-05-05 MED ORDER — EPHEDRINE 5 MG/ML INJ: 10.0000 mg | INTRAVENOUS | Status: DC | PRN

## 2018-05-05 MED ORDER — DIPHENHYDRAMINE HCL 50 MG/ML IJ SOLN
12.5000 mg | INTRAMUSCULAR | Status: DC | PRN
  Administered 2018-05-06: 12.5 mg via INTRAVENOUS
  Filled 2018-05-05: qty 1

## 2018-05-05 MED ORDER — OXYTOCIN BOLUS FROM INFUSION: 500.0000 mL | Freq: Once | INTRAVENOUS | Status: DC

## 2018-05-05 MED ORDER — ACETAMINOPHEN 325 MG PO TABS: 650.0000 mg | ORAL_TABLET | ORAL | Status: DC | PRN

## 2018-05-05 MED ORDER — SODIUM CHLORIDE 0.9 % IV SOLN
5.0000 10*6.[IU] | Freq: Once | INTRAVENOUS | Status: AC
  Administered 2018-05-05: 5 10*6.[IU] via INTRAVENOUS
  Filled 2018-05-05: qty 5

## 2018-05-05 MED ORDER — SODIUM CHLORIDE 0.9 % IV SOLN
INTRAVENOUS | Status: DC | PRN
  Administered 2018-05-05 (×2): 5 mL via EPIDURAL

## 2018-05-05 MED ORDER — OXYTOCIN 40 UNITS IN NORMAL SALINE INFUSION - SIMPLE MED
1.0000 m[IU]/min | INTRAVENOUS | Status: DC
  Administered 2018-05-05: 2 m[IU]/min via INTRAVENOUS
  Administered 2018-05-05: 6 m[IU]/min via INTRAVENOUS
  Administered 2018-05-05: 4 m[IU]/min via INTRAVENOUS

## 2018-05-05 MED ORDER — PENICILLIN G 3 MILLION UNITS IVPB - SIMPLE MED
3.0000 10*6.[IU] | INTRAVENOUS | Status: DC
  Administered 2018-05-05 – 2018-05-06 (×4): 3 10*6.[IU] via INTRAVENOUS
  Filled 2018-05-05 (×2): qty 100
  Filled 2018-05-05 (×2): qty 3
  Filled 2018-05-05 (×3): qty 100
  Filled 2018-05-05: qty 3
  Filled 2018-05-05 (×2): qty 100

## 2018-05-05 MED ORDER — BUTORPHANOL TARTRATE 2 MG/ML IJ SOLN: 1.0000 mg | INTRAMUSCULAR | Status: DC | PRN

## 2018-05-05 MED ORDER — LACTATED RINGERS IV SOLN
500.0000 mL | INTRAVENOUS | Status: DC | PRN
  Administered 2018-05-06: 500 mL via INTRAVENOUS

## 2018-05-05 MED ORDER — OXYTOCIN 10 UNIT/ML IJ SOLN
INTRAMUSCULAR | Status: AC
  Filled 2018-05-05: qty 2

## 2018-05-05 MED ORDER — LIDOCAINE-EPINEPHRINE (PF) 1.5 %-1:200000 IJ SOLN
INTRAMUSCULAR | Status: DC | PRN
  Administered 2018-05-05: 3 mL via EPIDURAL

## 2018-05-05 MED ORDER — ONDANSETRON HCL 4 MG/2ML IJ SOLN: 4.0000 mg | Freq: Four times a day (QID) | INTRAMUSCULAR | Status: DC | PRN

## 2018-05-05 MED ORDER — LIDOCAINE HCL (PF) 1 % IJ SOLN
INTRAMUSCULAR | Status: DC | PRN
  Administered 2018-05-05: 2 mL via SUBCUTANEOUS

## 2018-05-05 MED ORDER — AMMONIA AROMATIC IN INHA
RESPIRATORY_TRACT | Status: AC
  Filled 2018-05-05: qty 10

## 2018-05-05 MED ORDER — LACTATED RINGERS IV SOLN
500.0000 mL | Freq: Once | INTRAVENOUS | Status: AC
  Administered 2018-05-05: 500 mL via INTRAVENOUS

## 2018-05-05 MED ORDER — LIDOCAINE HCL (PF) 1 % IJ SOLN
INTRAMUSCULAR | Status: AC
  Filled 2018-05-05: qty 30

## 2018-05-05 MED ORDER — MISOPROSTOL 25 MCG QUARTER TABLET
25.0000 ug | ORAL_TABLET | ORAL | Status: DC | PRN
  Administered 2018-05-05 (×2): 25 ug via VAGINAL
  Filled 2018-05-05 (×2): qty 1

## 2018-05-05 MED ORDER — FENTANYL 2.5 MCG/ML W/ROPIVACAINE 0.15% IN NS 100 ML EPIDURAL (ARMC)
12.0000 mL/h | EPIDURAL | Status: DC
  Administered 2018-05-05 – 2018-05-06 (×2): 12 mL/h via EPIDURAL
  Filled 2018-05-05 (×2): qty 100

## 2018-05-05 MED ORDER — LACTATED RINGERS IV SOLN
INTRAVENOUS | Status: DC
  Administered 2018-05-05 – 2018-05-06 (×4): via INTRAVENOUS

## 2018-05-05 NOTE — H&P (Signed)
History and Physical Interval Note:  05/05/2018 9:28 AM  Kathleen Scott  has presented today for INDUCTION OF LABOR (cervical ripening agents),  with the diagnosis of Favorable cervix at term. The various methods of treatment have been discussed with the patient and family. After consideration of risks, benefits and other options for treatment, the patient has consented to  Labor induction .  The patient's history has been reviewed, patient examined, no change in status, and is stable for induction as planned.  See H&P. I have reviewed the patient's chart and labs.  Questions were answered to the patient's satisfaction.    Annamarie Major, MD, [facility], [facility] 05/05/2018  9:28 AM

## 2018-05-05 NOTE — Progress Notes (Signed)
  Labor Progress Note   33 y.o. G1P0 @ 3167w5d , admitted for  Pregnancy, Labor Management.   Subjective:  2/10 pain w ctxs    (every 3-5 min after 2 doses Cytotec)  Objective:  BP 128/83 (BP Location: Left Arm)   Pulse 80   Temp 98.1 F (36.7 C) (Oral)   Resp 16   Ht 5\' 6"  (1.676 m)   Wt 79.8 kg   LMP 07/31/2017   BMI 28.41 kg/m  Abd: gravid NT ND Extr: trace to 1+ bilateral pedal edema SVE: CERVIX: 3 cm dilated, 75 effaced, -3 station  EFM: FHR: 140 bpm, variability: moderate,  accelerations:  Present,  decelerations:  Absent Toco: Frequency: Every 3-5 minutes Labs: I have reviewed the patient's lab results.   Assessment & Plan:  G1P0 @ 9167w5d, admitted for  Pregnancy and Labor/Delivery Management  1. Pain management: none. 2. FWB: FHT category 1.  3. ID: GBS positive 4. Labor management: Pitocin now, discussed pros and cons AROM when appropriate Epidural when desired  All discussed with patient, see orders  Annamarie MajorPaul Eual Lindstrom, MD, Merlinda FrederickFACOG [facility], [facility] 05/05/2018  8:36 PM

## 2018-05-05 NOTE — Anesthesia Preprocedure Evaluation (Signed)
Anesthesia Evaluation  Patient identified by MRN, date of birth, ID band Patient awake    Reviewed: Allergy & Precautions, H&P , NPO status , Patient's Chart, lab work & pertinent test results, reviewed documented beta blocker date and time   History of Anesthesia Complications Negative for: history of anesthetic complications  Airway Mallampati: III  TM Distance: >3 FB Neck ROM: full    Dental  (+) Teeth Intact   Pulmonary neg pulmonary ROS,           Cardiovascular Exercise Tolerance: Good (-) angina(-) Past MI (-) dysrhythmias + Valvular Problems/Murmurs   H/o pulmonary stenosis repaired at 1 day old.  No problems since that time.  Echo looks good with EF of 55%   Neuro/Psych negative neurological ROS  negative psych ROS   GI/Hepatic Neg liver ROS, GERD  ,  Endo/Other  negative endocrine ROS  Renal/GU negative Renal ROS  negative genitourinary   Musculoskeletal   Abdominal   Peds  Hematology negative hematology ROS (+)   Anesthesia Other Findings Past Medical History: No date: Congenital Pulmonic Stenosis     Comment:  a. 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 06/2015 Echo: EF               50-55%, septal wall HK 2/2 post-op state. Mild MR. Mildly              dil RV size. Nl RV fxn. Triv PR. Nl PASP. No date: Heart murmur No date: Syncope and collapse   Reproductive/Obstetrics negative OB ROS                             Anesthesia Physical Anesthesia Plan  ASA: II  Anesthesia Plan: Epidural   Post-op Pain Management:    Induction:   PONV Risk Score and Plan:   Airway Management Planned:   Additional Equipment:   Intra-op Plan:   Post-operative Plan:   Informed Consent: I have reviewed the patients History and Physical, chart, labs and discussed the procedure including the risks, benefits and alternatives for  the proposed anesthesia with the patient or authorized representative who has indicated his/her understanding and acceptance.     Dental Advisory Given  Plan Discussed with: Anesthesiologist, CRNA and Surgeon  Anesthesia Plan Comments:         Anesthesia Quick Evaluation

## 2018-05-05 NOTE — Progress Notes (Signed)
  Labor Progress Note   33 y.o. G1P0 @ [redacted]w[redacted]d , admitted for  Pregnancy, Labor Management.   Subjective:  Min pain after epidural  Objective:  BP 128/73 (BP Location: Left Arm)   Pulse 92   Temp 98.1 F (36.7 C) (Oral)   Resp 16   Ht 5\' 6"  (1.676 m)   Wt 79.8 kg   LMP 07/31/2017   SpO2 98%   BMI 28.41 kg/m  Abd: gravid ND Extr: trace to 1+ bilateral pedal edema SVE: CERVIX: 3 cm dilated, 75 effaced, -3 station  EFM: FHR: 140 bpm, variability: moderate,  accelerations:  Present,  decelerations:  Absent Toco: Frequency: Every 2 minutes Labs: I have reviewed the patient's lab results.   Assessment & Plan:  G1P0 @ [redacted]w[redacted]d, admitted for  Pregnancy and Labor/Delivery Management  1. Pain management: epidural. 2. FWB: FHT category 1.  3. ID: GBS positive 4. Labor management: AROM clear IUPC placed Manage Pitocin accordingly  All discussed with patient, see orders  Annamarie Major, MD, [facility], [facility] 05/05/2018  11:50 PM

## 2018-05-05 NOTE — Progress Notes (Signed)
  Labor Progress Note   33 y.o. G1P0 @ [redacted]w[redacted]d , admitted for  Pregnancy, Labor Management.   Subjective:  Min pain  Objective:  BP (!) 142/85   Pulse 88   Temp 98.1 F (36.7 C) (Oral)   Resp 16   Ht 5\' 6"  (1.676 m)   Wt 79.8 kg   LMP 07/31/2017   BMI 28.41 kg/m  Abd: mild Extr: trace to 1+ bilateral pedal edema SVE: CERVIX: 1-2 cm dilated, 60 effaced, -3 station  EFM: FHR: 140 bpm, variability: moderate,  accelerations:  Present,  decelerations:  Absent Toco: Frequency: Every 4-8 minutes Labs: I have reviewed the patient's lab results.   Assessment & Plan:  G1P0 @ [redacted]w[redacted]d, admitted for  Pregnancy and Labor/Delivery Management  1. Pain management: none. 2. FWB: FHT category 1.  3. ID: GBS positive 4. Labor management: Cytotec #2 placed. ABX for GBS  All discussed with patient, see orders  Annamarie Major, MD, [facility] Frederick [facility]/G[facility], [facility] 05/05/2018  1:57 PM

## 2018-05-05 NOTE — Plan of Care (Signed)
Pt arrives to L&D for scheduled IOL. Pt induced with cytotec and patient educated on all medications and fluids administered. Pt verbalized understanding and all questions answered.

## 2018-05-05 NOTE — Anesthesia Procedure Notes (Signed)
Epidural Patient location during procedure: OB Start time: 05/05/2018 10:38 PM End time: 05/05/2018 10:47 PM  Staffing Anesthesiologist: Lenard Simmer, MD Performed: anesthesiologist   Preanesthetic Checklist Completed: patient identified, site marked, surgical consent, pre-op evaluation, timeout performed, IV checked, risks and benefits discussed and monitors and equipment checked  Epidural Patient position: sitting Prep: ChloraPrep Patient monitoring: heart rate, continuous pulse ox and blood pressure Approach: midline Location: L3-L4 Injection technique: LOR saline  Needle:  Needle type: Tuohy  Needle gauge: 17 G Needle length: 9 cm and 9 Needle insertion depth: 4 cm Catheter type: closed end flexible Catheter size: 19 Gauge Catheter at skin depth: 9 cm Test dose: negative and 1.5% lidocaine with Epi 1:200 K  Assessment Sensory level: T10 Events: blood not aspirated, injection not painful, no injection resistance, negative IV test and no paresthesia  Additional Notes 1st attempt Pt. Evaluated and documentation done after procedure finished. Patient identified. Risks/Benefits/Options discussed with patient including but not limited to bleeding, infection, nerve damage, paralysis, failed block, incomplete pain control, headache, blood pressure changes, nausea, vomiting, reactions to medication both or allergic, itching and postpartum back pain. Confirmed with bedside nurse the patient's most recent platelet count. Confirmed with patient that they are not currently taking any anticoagulation, have any bleeding history or any family history of bleeding disorders. Patient expressed understanding and wished to proceed. All questions were answered. Sterile technique was used throughout the entire procedure. Please see nursing notes for vital signs. Test dose was given through epidural catheter and negative prior to continuing to dose epidural or start infusion. Warning signs of high  block given to the patient including shortness of breath, tingling/numbness in hands, complete motor block, or any concerning symptoms with instructions to call for help. Patient was given instructions on fall risk and not to get out of bed. All questions and concerns addressed with instructions to call with any issues or inadequate analgesia.   Patient tolerated the insertion well without immediate complications.Reason for block:procedure for pain

## 2018-05-06 ENCOUNTER — Encounter
Admission: EM | Disposition: A | Discharge status: Home / Self Care | Payer: Self-pay | Source: Home / Self Care | Attending: Obstetrics & Gynecology

## 2018-05-06 DIAGNOSIS — O321XX0 Maternal care for breech presentation, not applicable or unspecified: Secondary | ICD-10-CM | POA: Clinically undetermined

## 2018-05-06 DIAGNOSIS — O99824 Streptococcus B carrier state complicating childbirth: Secondary | ICD-10-CM

## 2018-05-06 LAB — RPR: RPR Ser Ql: NONREACTIVE

## 2018-05-06 SURGERY — Surgical Case
Anesthesia: Epidural

## 2018-05-06 MED ORDER — OXYCODONE-ACETAMINOPHEN 5-325 MG PO TABS: 1.0000 | ORAL_TABLET | ORAL | Status: DC | PRN

## 2018-05-06 MED ORDER — ZOLPIDEM TARTRATE 5 MG PO TABS: 5.0000 mg | ORAL_TABLET | Freq: Every evening | ORAL | Status: DC | PRN

## 2018-05-06 MED ORDER — SIMETHICONE 80 MG PO CHEW
80.0000 mg | CHEWABLE_TABLET | Freq: Three times a day (TID) | ORAL | Status: DC
  Administered 2018-05-06 – 2018-05-09 (×8): 80 mg via ORAL
  Filled 2018-05-06 (×8): qty 1

## 2018-05-06 MED ORDER — ONDANSETRON HCL 4 MG/2ML IJ SOLN
INTRAMUSCULAR | Status: DC | PRN
  Administered 2018-05-06: 4 mg via INTRAVENOUS

## 2018-05-06 MED ORDER — NALBUPHINE HCL 10 MG/ML IJ SOLN: 5.0000 mg | INTRAMUSCULAR | Status: DC | PRN

## 2018-05-06 MED ORDER — NALOXONE HCL 0.4 MG/ML IJ SOLN: 0.4000 mg | INTRAMUSCULAR | Status: DC | PRN

## 2018-05-06 MED ORDER — SUCCINYLCHOLINE CHLORIDE 20 MG/ML IJ SOLN
INTRAMUSCULAR | Status: AC
  Filled 2018-05-06: qty 1

## 2018-05-06 MED ORDER — SODIUM CHLORIDE 0.9 % IV SOLN
INTRAVENOUS | Status: AC
  Filled 2018-05-06: qty 2

## 2018-05-06 MED ORDER — MORPHINE SULFATE (PF) 2 MG/ML IV SOLN: 1.0000 mg | INTRAVENOUS | Status: DC | PRN

## 2018-05-06 MED ORDER — SODIUM CHLORIDE 0.9% FLUSH: 3.0000 mL | INTRAVENOUS | Status: DC | PRN

## 2018-05-06 MED ORDER — SCOPOLAMINE 1 MG/3DAYS TD PT72: 1.0000 | MEDICATED_PATCH | Freq: Once | TRANSDERMAL | Status: DC

## 2018-05-06 MED ORDER — BUPIVACAINE HCL (PF) 0.5 % IJ SOLN
INTRAMUSCULAR | Status: AC
Start: 2018-05-06 — End: 2018-05-06
  Filled 2018-05-06: qty 30

## 2018-05-06 MED ORDER — COCONUT OIL OIL
1.0000 "application " | TOPICAL_OIL | Status: DC | PRN
  Administered 2018-05-08: 1 via TOPICAL
  Filled 2018-05-06: qty 120

## 2018-05-06 MED ORDER — LIDOCAINE HCL (PF) 2 % IJ SOLN
INTRAMUSCULAR | Status: DC | PRN
  Administered 2018-05-06 (×2): 100 mg via INTRADERMAL
  Administered 2018-05-06: 100 mg via EPIDURAL
  Administered 2018-05-06: 100 mg via INTRADERMAL

## 2018-05-06 MED ORDER — SODIUM CHLORIDE 0.9 % IV SOLN
2.0000 g | INTRAVENOUS | Status: AC
  Administered 2018-05-06: 2 g via INTRAVENOUS

## 2018-05-06 MED ORDER — SOD CITRATE-CITRIC ACID 500-334 MG/5ML PO SOLN
ORAL | Status: AC
  Administered 2018-05-06: 30 mL via ORAL
  Filled 2018-05-06: qty 15

## 2018-05-06 MED ORDER — NALOXONE HCL 4 MG/10ML IJ SOLN
1.0000 ug/kg/h | INTRAVENOUS | Status: DC | PRN
  Filled 2018-05-06: qty 5

## 2018-05-06 MED ORDER — BUPIVACAINE HCL (PF) 0.5 % IJ SOLN
INTRAMUSCULAR | Status: AC
  Filled 2018-05-06: qty 30

## 2018-05-06 MED ORDER — SOD CITRATE-CITRIC ACID 500-334 MG/5ML PO SOLN
30.0000 mL | ORAL | Status: AC
  Administered 2018-05-06: 30 mL via ORAL
  Filled 2018-05-06: qty 15

## 2018-05-06 MED ORDER — ACETAMINOPHEN 325 MG PO TABS
650.0000 mg | ORAL_TABLET | ORAL | Status: DC | PRN
  Administered 2018-05-07 – 2018-05-08 (×3): 650 mg via ORAL
  Filled 2018-05-06 (×3): qty 2

## 2018-05-06 MED ORDER — DIPHENHYDRAMINE HCL 25 MG PO CAPS: 25.0000 mg | ORAL_CAPSULE | ORAL | Status: DC | PRN

## 2018-05-06 MED ORDER — NALBUPHINE HCL 10 MG/ML IJ SOLN: 5.0000 mg | Freq: Once | INTRAMUSCULAR | Status: DC | PRN

## 2018-05-06 MED ORDER — BUPIVACAINE HCL 0.5 % IJ SOLN
10.0000 mL | Freq: Once | INTRAMUSCULAR | Status: DC
  Filled 2018-05-06: qty 10

## 2018-05-06 MED ORDER — LIDOCAINE HCL (PF) 2 % IJ SOLN
INTRAMUSCULAR | Status: AC
  Filled 2018-05-06: qty 40

## 2018-05-06 MED ORDER — PRENATAL MULTIVITAMIN CH
1.0000 | ORAL_TABLET | Freq: Every day | ORAL | Status: DC
  Administered 2018-05-07 – 2018-05-08 (×2): 1 via ORAL
  Filled 2018-05-06 (×2): qty 1

## 2018-05-06 MED ORDER — MEPERIDINE HCL 25 MG/ML IJ SOLN: 6.2500 mg | INTRAMUSCULAR | Status: DC | PRN

## 2018-05-06 MED ORDER — DIBUCAINE 1 % RE OINT: 1.0000 "application " | TOPICAL_OINTMENT | RECTAL | Status: DC | PRN

## 2018-05-06 MED ORDER — SENNOSIDES-DOCUSATE SODIUM 8.6-50 MG PO TABS
2.0000 | ORAL_TABLET | ORAL | Status: DC
  Administered 2018-05-07 – 2018-05-08 (×3): 2 via ORAL
  Filled 2018-05-06 (×3): qty 2

## 2018-05-06 MED ORDER — DIPHENHYDRAMINE HCL 25 MG PO CAPS: 25.0000 mg | ORAL_CAPSULE | Freq: Four times a day (QID) | ORAL | Status: DC | PRN

## 2018-05-06 MED ORDER — BUPIVACAINE HCL 0.5 % IJ SOLN
INTRAMUSCULAR | Status: DC | PRN
  Administered 2018-05-06: 10 mL

## 2018-05-06 MED ORDER — MENTHOL 3 MG MT LOZG
1.0000 | LOZENGE | OROMUCOSAL | Status: DC | PRN
  Filled 2018-05-06: qty 9

## 2018-05-06 MED ORDER — ONDANSETRON HCL 4 MG/2ML IJ SOLN: 4.0000 mg | Freq: Three times a day (TID) | INTRAMUSCULAR | Status: DC | PRN

## 2018-05-06 MED ORDER — WITCH HAZEL-GLYCERIN EX PADS: 1.0000 "application " | MEDICATED_PAD | CUTANEOUS | Status: DC | PRN

## 2018-05-06 MED ORDER — ONDANSETRON HCL 4 MG/2ML IJ SOLN
INTRAMUSCULAR | Status: AC
Start: 2018-05-06 — End: ?
  Filled 2018-05-06: qty 2

## 2018-05-06 MED ORDER — DIPHENHYDRAMINE HCL 50 MG/ML IJ SOLN: 12.5000 mg | INTRAMUSCULAR | Status: DC | PRN

## 2018-05-06 MED ORDER — MORPHINE SULFATE (PF) 0.5 MG/ML IJ SOLN
INTRAMUSCULAR | Status: AC
  Filled 2018-05-06: qty 10

## 2018-05-06 MED ORDER — LACTATED RINGERS IV SOLN
INTRAVENOUS | Status: DC
  Administered 2018-05-07: 10:00:00 via INTRAVENOUS

## 2018-05-06 MED ORDER — MORPHINE SULFATE (PF) 0.5 MG/ML IJ SOLN
INTRAMUSCULAR | Status: DC | PRN
  Administered 2018-05-06: 2 mg via EPIDURAL
  Administered 2018-05-06: 1 mg via EPIDURAL

## 2018-05-06 MED ORDER — PHENYLEPHRINE HCL 10 MG/ML IJ SOLN
INTRAMUSCULAR | Status: AC
  Filled 2018-05-06: qty 1

## 2018-05-06 MED ORDER — SIMETHICONE 80 MG PO CHEW: 80.0000 mg | CHEWABLE_TABLET | ORAL | Status: DC | PRN

## 2018-05-06 MED ORDER — PRENATAL MULTIVITAMIN CH: 1.0000 | ORAL_TABLET | Freq: Every day | ORAL | Status: DC

## 2018-05-06 MED ORDER — KETOROLAC TROMETHAMINE 30 MG/ML IJ SOLN: 30.0000 mg | Freq: Four times a day (QID) | INTRAMUSCULAR | Status: DC

## 2018-05-06 MED ORDER — LACTATED RINGERS IV SOLN: INTRAVENOUS | Status: DC

## 2018-05-06 MED ORDER — BUPIVACAINE 0.25 % ON-Q PUMP DUAL CATH 400 ML
400.0000 mL | INJECTION | Status: DC
  Filled 2018-05-06: qty 400

## 2018-05-06 MED ORDER — PROPOFOL 10 MG/ML IV BOLUS
INTRAVENOUS | Status: AC
  Filled 2018-05-06: qty 20

## 2018-05-06 MED ORDER — ACETAMINOPHEN 500 MG PO TABS
1000.0000 mg | ORAL_TABLET | Freq: Four times a day (QID) | ORAL | Status: AC
  Administered 2018-05-06 – 2018-05-07 (×4): 1000 mg via ORAL
  Filled 2018-05-06 (×4): qty 2

## 2018-05-06 MED ORDER — SIMETHICONE 80 MG PO CHEW
80.0000 mg | CHEWABLE_TABLET | ORAL | Status: DC
  Administered 2018-05-07 – 2018-05-08 (×3): 80 mg via ORAL
  Filled 2018-05-06 (×3): qty 1

## 2018-05-06 MED ORDER — BUPIVACAINE ON-Q PAIN PUMP (FOR ORDER SET NO CHG): INJECTION | Status: DC

## 2018-05-06 MED ORDER — OXYTOCIN 40 UNITS IN NORMAL SALINE INFUSION - SIMPLE MED
2.5000 [IU]/h | INTRAVENOUS | Status: AC
  Administered 2018-05-06: 2.5 [IU]/h via INTRAVENOUS
  Filled 2018-05-06 (×2): qty 1000

## 2018-05-06 MED ORDER — PHENYLEPHRINE HCL 10 MG/ML IJ SOLN
INTRAMUSCULAR | Status: DC | PRN
  Administered 2018-05-06 (×3): 100 ug via INTRAVENOUS

## 2018-05-06 MED ORDER — KETOROLAC TROMETHAMINE 30 MG/ML IJ SOLN: 30.0000 mg | Freq: Once | INTRAMUSCULAR | Status: AC

## 2018-05-06 MED ORDER — EPHEDRINE SULFATE 50 MG/ML IJ SOLN
INTRAMUSCULAR | Status: AC
  Filled 2018-05-06: qty 1

## 2018-05-06 MED ORDER — KETOROLAC TROMETHAMINE 30 MG/ML IJ SOLN
30.0000 mg | Freq: Four times a day (QID) | INTRAMUSCULAR | Status: AC
  Administered 2018-05-06 – 2018-05-07 (×4): 30 mg via INTRAVENOUS
  Filled 2018-05-06 (×4): qty 1

## 2018-05-06 SURGICAL SUPPLY — 25 items
CANISTER SUCT 3000ML PPV (MISCELLANEOUS) ×3 IMPLANT
CATH KIT ON-Q SILVERSOAK 5IN (CATHETERS) ×6 IMPLANT
CHLORAPREP W/TINT 26ML (MISCELLANEOUS) ×6 IMPLANT
COVER WAND RF STERILE (DRAPES) ×3 IMPLANT
DERMABOND ADVANCED (GAUZE/BANDAGES/DRESSINGS) ×2
DERMABOND ADVANCED .7 DNX12 (GAUZE/BANDAGES/DRESSINGS) ×1 IMPLANT
DRSG OPSITE POSTOP 4X10 (GAUZE/BANDAGES/DRESSINGS) ×3 IMPLANT
ELECT CAUTERY BLADE 6.4 (BLADE) ×3 IMPLANT
ELECT REM PT RETURN 9FT ADLT (ELECTROSURGICAL) ×3
ELECTRODE REM PT RTRN 9FT ADLT (ELECTROSURGICAL) ×1 IMPLANT
GLOVE SKINSENSE NS SZ8.0 LF (GLOVE) ×2
GLOVE SKINSENSE STRL SZ8.0 LF (GLOVE) ×1 IMPLANT
GOWN STRL REUS W/ TWL LRG LVL3 (GOWN DISPOSABLE) ×1 IMPLANT
GOWN STRL REUS W/ TWL XL LVL3 (GOWN DISPOSABLE) ×2 IMPLANT
GOWN STRL REUS W/TWL LRG LVL3 (GOWN DISPOSABLE) ×2
GOWN STRL REUS W/TWL XL LVL3 (GOWN DISPOSABLE) ×4
NS IRRIG 1000ML POUR BTL (IV SOLUTION) ×3 IMPLANT
PACK C SECTION AR (MISCELLANEOUS) ×3 IMPLANT
PAD OB MATERNITY 4.3X12.25 (PERSONAL CARE ITEMS) ×3 IMPLANT
PAD PREP 24X41 OB/GYN DISP (PERSONAL CARE ITEMS) ×3 IMPLANT
SUT MAXON ABS #0 GS21 30IN (SUTURE) ×6 IMPLANT
SUT PLAIN 2 0 XLH (SUTURE) ×6 IMPLANT
SUT VIC AB 1 CT1 36 (SUTURE) ×12 IMPLANT
SUT VIC AB 2-0 CT1 36 (SUTURE) ×3 IMPLANT
SUT VIC AB 4-0 FS2 27 (SUTURE) ×3 IMPLANT

## 2018-05-06 NOTE — Progress Notes (Signed)
  Labor Progress Note   33 y.o. G1P0 @ [redacted]w[redacted]d , admitted for  Pregnancy, Labor Management.   Subjective:  No pain. Epidural effective. Able to move legs.  Objective:  BP (!) 96/52 (BP Location: Left Arm)   Pulse 86   Temp 97.9 F (36.6 C) (Oral)   Resp 16   Ht 5\' 6"  (1.676 m)   Wt 79.8 kg   LMP 07/31/2017   SpO2 98%   BMI 28.41 kg/m  Abd: gravid ND Extr: trace to 1+ bilateral pedal edema SVE: CERVIX: 4-5 cm dilated, 75 effaced, -3 station (still high station, Vtx,   But not engaged well.  EFM: FHR: 120 bpm, variability: moderate,  accelerations:  Present,  decelerations:  Absent Toco: Frequency: Every 2-4 minutes Labs: I have reviewed the patient's lab results.   Assessment & Plan:  G1P0 @ [redacted]w[redacted]d, admitted for  Pregnancy and Labor/Delivery Management  1. Pain management: epidural. 2. FWB: FHT category 1.  3. ID: GBS positive 4. Labor management: Options based on failure to dilate or progress discussed (several hours at this dilation, and station has never engaged well or dropped).  CS discussed, decided upon. The risks of cesarean section discussed with the patient included but were not limited to: bleeding which may require transfusion or reoperation; infection which may require antibiotics; injury to bowel, bladder, ureters or other surrounding organs; injury to the fetus; need for additional procedures including hysterectomy in the event of a life-threatening hemorrhage; placental abnormalities wth subsequent pregnancies, incisional problems, thromboembolic phenomenon and other postoperative/anesthesia complications. The patient concurred with the proposed plan, giving informed written consent for the procedure.   All discussed with patient, see orders  Annamarie Major, MD, [facility] Frederick Ob/G[facility], [facility] 05/06/2018  7:35 AM

## 2018-05-06 NOTE — Discharge Instructions (Signed)

## 2018-05-06 NOTE — Transfer of Care (Signed)
Immediate Anesthesia Transfer of Care Note  Patient: Kathleen Scott  Procedure(s) Performed: CESAREAN SECTION (N/A )  Patient Location: PACU  Anesthesia Type:Epidural  Level of Consciousness: awake, alert  and oriented  Airway & Oxygen Therapy: Patient Spontanous Breathing  Post-op Assessment: Report given to RN and Post -op Vital signs reviewed and stable  Post vital signs: Reviewed and stable  Last Vitals:  Vitals Value Taken Time  BP 103/66 05/06/2018  9:47 AM  Temp 36.6 C 05/06/2018  9:47 AM  Pulse 79 05/06/2018  9:47 AM  Resp 21 05/06/2018  9:47 AM  SpO2 98 % 05/06/2018  9:47 AM  Vitals shown include unvalidated device data.  Last Pain:  Vitals:   05/06/18 0947  TempSrc:   PainSc: 0-No pain      Patients Stated Pain Goal: 0 (05/05/18 1800)  Complications: No apparent anesthesia complications

## 2018-05-06 NOTE — Op Note (Signed)
Cesarean Section Procedure Note Indications: failure to progress: arrest of dilation and term intrauterine pregnancy  Pre-operative Diagnosis: Intrauterine pregnancy [redacted]w[redacted]d ;  failure to progress: arrest of dilation, malpresentation: complete breech and term intrauterine pregnancy Post-operative Diagnosis: same, delivered. Procedure: Low Transverse Cesarean Section Surgeon: Annamarie Major, MD, FACOG Assistant(s): Kandice Hams Anesthesia: Epidural anesthesia Estimated Blood Loss:530 mL Complications: None; patient tolerated the procedure well. Disposition: PACU - hemodynamically stable. Condition: stable  Findings: A female infant in the breech (complete) presentation. Amniotic fluid - Clear  Birth weight 8-5lbs.  Apgars of 3 and 9.  Intact placenta with a three-vessel cord. Grossly normal uterus, tubes and ovaries bilaterally. No intraabdominal adhesions were noted.  Procedure Details   The patient was taken to Operating Room, identified as the correct patient and the procedure verified as C-Section Delivery. A Time Out was held and the above information confirmed. After induction of anesthesia, the patient was draped and prepped in the usual sterile manner. A Pfannenstiel incision was made and carried down through the subcutaneous tissue to the fascia. Fascial incision was made and extended transversely with the Mayo scissors. The fascia was separated from the underlying rectus tissue superiorly and inferiorly. The peritoneum was identified and entered bluntly. Peritoneal incision was extended longitudinally. The utero-vesical peritoneal reflection was incised transversely and a bladder flap was created digitally.  A low transverse hysterotomy was made. The fetus was delivered atraumatically. The umbilical cord was clamped x2 and cut and the infant was handed to the awaiting pediatricians. The placenta was removed intact and appeared normal with a 3-vessel cord.  The uterus was exteriorized and  cleared of all clot and debris. The hysterotomy was closed with running sutures of 0 Vicryl suture. A second imbricating layer was placed with the same suture. Excellent hemostasis was observed. The uterus was returned to the abdomen. The pelvis was irrigated and again, excellent hemostasis was noted.  The On Q Pain pump System was then placed.  Trocars were placed through the abdominal wall into the subfascial space and these were used to thread the silver soaker cathaters into place.The rectus fascia was then reapproximated with running sutures of Maxon, with careful placement not to incorporate the cathaters. Subcutaneous tissues are then irrigated with saline and hemostasis assured.  Skin is then closed with 4-0 vicryl suture in a subcuticular fashion followed by skin adhesive. The cathaters are flushed each with 5 mL of Bupivicaine and stabilized into place with dressing. Instrument, sponge, and needle counts were correct prior to the abdominal closure and at the conclusion of the case.  The patient tolerated the procedure well and was transferred to the recovery room in stable condition.   Annamarie Major, MD, [facility] Ob/G[facility], [facility] 05/06/2018  9:40 AM

## 2018-05-06 NOTE — Discharge Summary (Signed)
OB Discharge Summary     Patient Name: Kathleen Scott DOB: 1985/05/09 MRN: [id number]  Date of admission: 05/05/2018 Delivering MD: Letitia Libraobert Paul Harris, MD  Date of Delivery: 05/06/2018  Date of discharge: 05/09/2018 Admitting diagnosis: LABOR, favorable cervix at term Intrauterine pregnancy: 5825w6d     Secondary diagnosis: None     Discharge diagnosis: Term Pregnancy Delivered, Reasons for cesarean section  Arrest of Dilation and Malpresentation breech                         Hospital course:  Induction of Labor With Cesarean Section  33 y.o. yo G1P0 at 8425w6d was admitted to the hospital 05/05/2018 for induction of labor. Patient had a labor course significant for AROM clear, Cytotec, Pitocin, Epidural. The patient went for cesarean section due to Malpresentation and Arrest of Dilation, as she was found to be breech only at this time, and delivered a Viable infant,05/06/2018  Membrane Rupture Time/Date: 11:44 PM ,05/05/2018   Details of operation can be found in separate operative Note.  Patient had a postpartum course complicated by anemia. . She is ambulating, tolerating a regular diet, passing flatus, and urinating well. She received Rhogam as her baby was A positive blood type. Patient is discharged home in stable condition on 05/09/2018.                                                                                                  Post partum procedures:none  Complications: None  Physical exam on 05/09/2018: Vitals:   05/08/18 0700 05/08/18 1926 05/08/18 2304 05/09/18 0858  BP: 123/70 133/84 128/69 (!) 129/93  Pulse: 79 86 82 80  Resp: 18 18 18 16   Temp: 98 F (36.7 C) 98.2 F (36.8 C) 97.9 F (36.6 C) 98.3 F (36.8 C)  TempSrc: Oral Oral Oral Oral  SpO2: 99% 100% 100% 98%  Weight:      Height:       General: alert, cooperative and no distress  Heart: RRR with a grade III/VI systolic murmur best heard at the base of her heart.  Lungs: CTAB/ normal respiratory effort Lochia:  appropriate Abdomen: soft, NABS Incision: C+D+I, ON Q intact DVT Evaluation: No evidence of DVT seen on physical exam. +1 to +2 Bilateral pitting edema  Labs: Lab Results  Component Value Date   WBC 12.3 (H) 05/07/2018   HGB 7.2 (L) 05/07/2018   HCT 22.6 (L) 05/07/2018   MCV 82.8 05/07/2018   PLT 198 05/07/2018   CMP Latest Ref Rng & Units 05/07/2018  Glucose 70 - 99 mg/dL 244(W137(H)  BUN 6 - 20 mg/dL 12  Creatinine 1.020.44 - 7.251.00 mg/dL 3.660.69  Sodium 440135 - 347145 mmol/L 131(L)  Potassium 3.5 - 5.1 mmol/L 3.7  Chloride 98 - 111 mmol/L 105  CO2 22 - 32 mmol/L 22  Calcium 8.9 - 10.3 mg/dL 7.4(L)  Total Protein 6.5 - 8.1 g/dL 5.0(L)  Total Bilirubin 0.3 - 1.2 mg/dL 0.4  Alkaline Phos 38 - 126 U/L 107  AST 15 - 41 U/L 30  ALT 0 -  44 U/L 13    Discharge instruction: per After Visit Summary.  Medications:  Allergies as of 05/09/2018   No Known Allergies     Medication List    TAKE these medications   docusate sodium 100 MG capsule Commonly known as:  COLACE Take 1 capsule (100 mg total) by mouth 2 (two) times daily as needed for mild constipation or moderate constipation.   ferrous sulfate 325 (65 FE) MG tablet Take 1 tablet (325 mg total) by mouth 2 (two) times daily with a meal.   ibuprofen 600 MG tablet Commonly known as:  ADVIL,MOTRIN Take 1 tablet (600 mg total) by mouth every 6 (six) hours.   norethindrone 0.35 MG tablet Commonly known as:  MICRONOR,CAMILA,ERRIN Take 1 tablet (0.35 mg total) by mouth daily. Start taking on:  June 03, 2018   prenatal multivitamin Tabs tablet Take 1 tablet by mouth daily at 12 noon.       Diet: routine diet  Activity: Advance as tolerated. Pelvic rest for 6 weeks.   Outpatient follow up: Follow-up Information    Nadara Mustard, MD. Go on 05/14/2018.   Specialty:  Obstetrics and Gynecology Why:  05/14/2018 at 1120 for incision check Contact information: [address] [place] [address] [phone]              Postpartum contraception: Progesterone only pills Rhogam Given postpartum: yes, baby was A pos, mother A neg Rubella vaccine given postpartum: no Varicella vaccine given postpartum: no TDaP given antepartum or postpartum: Yes  Newborn Data: Live born female [name] Birth Weight: 8 lb 5.3 oz (3780 g) APGAR: 3, 9  Newborn Delivery   Birth date/time:  05/06/2018 08:59:00 Delivery type:  C-Section, Low Transverse C-section categorization:  Primary      Baby Feeding: Breast  Disposition:home with mother  SIGNED:  Farrel Conners, CNM 05/09/2018 10:09 AM

## 2018-05-06 NOTE — Anesthesia Post-op Follow-up Note (Signed)
Anesthesia QCDR form completed.        

## 2018-05-06 NOTE — Consult Note (Signed)
Neonatology Note:   Attendance at C-section:    I was asked by Dr. Tiburcio Pea to attend this C/S at term due to [facility]. The mother is a G1, GBS + with aIAP with good prenatal care.Mother with h/o Pulm Valve Stenosis; [facility] ECHO normal.  Decels with Pitocin.  Reassuring fetal status PTD. ROM 10 hours before delivery, breech presentation, fluid clear. Infant not vigorous nor with good spontaneous cry and tone despite stimulation.  Cord clamped and infant brought to warmer.  Stunned appearance.  HR ~60 with no resp effort or tone.  CPAP initiated then short course PPV; great response.  Active cry and HR >100.   Color and tone quickly normalized.  Resp support removed. Sao2 placed and in 90s.  Bulb suctioning. Ap 3/9. Lungs clear to ausc in DR with good perfusion and pulses.  Alert, active, appropriate.  Clavicles intact. Two very superficial scratches on lower posterior torso. Dad at bedside; mother updated.  To CN to care of Pediatrician.    Dineen Kid Leary Roca, MD Neonatologist 05/06/2018, 9:33 AM

## 2018-05-07 ENCOUNTER — Encounter: Payer: Self-pay | Admitting: Obstetrics & Gynecology

## 2018-05-07 LAB — TYPE AND SCREEN
ABO/RH(D): A NEG
ANTIBODY SCREEN: POSITIVE
Unit division: 0
Unit division: 0

## 2018-05-07 LAB — BPAM RBC
BLOOD PRODUCT EXPIRATION DATE: 202002182359
Blood Product Expiration Date: 202002192359
Unit Type and Rh: 600
Unit Type and Rh: 600

## 2018-05-07 LAB — CBC
HCT: 22.7 % — ABNORMAL LOW (ref 36.0–46.0)
HCT: 22.6 % — ABNORMAL LOW (ref 36.0–46.0)
Hemoglobin: 7 g/dL — ABNORMAL LOW (ref 12.0–15.0)
Hemoglobin: 7.2 g/dL — ABNORMAL LOW (ref 12.0–15.0)
MCH: 25.7 pg — ABNORMAL LOW (ref 26.0–34.0)
MCH: 26.4 pg (ref 26.0–34.0)
MCHC: 30.8 g/dL (ref 30.0–36.0)
MCHC: 31.9 g/dL (ref 30.0–36.0)
MCV: 83.5 fL (ref 80.0–100.0)
MCV: 82.8 fL (ref 80.0–100.0)
PLATELETS: 198 10*3/uL (ref 150–400)
Platelets: 201 10*3/uL (ref 150–400)
RBC: 2.72 MIL/uL — ABNORMAL LOW (ref 3.87–5.11)
RBC: 2.73 MIL/uL — AB (ref 3.87–5.11)
RDW: 15.2 % (ref 11.5–15.5)
RDW: 15.3 % (ref 11.5–15.5)
WBC: 11.6 10*3/uL — ABNORMAL HIGH (ref 4.0–10.5)
WBC: 12.3 10*3/uL — ABNORMAL HIGH (ref 4.0–10.5)
nRBC: 0 % (ref 0.0–0.2)
nRBC: 0 % (ref 0.0–0.2)

## 2018-05-07 LAB — COMPREHENSIVE METABOLIC PANEL
ALT: 13 U/L (ref 0–44)
AST: 30 U/L (ref 15–41)
Albumin: 2.1 g/dL — ABNORMAL LOW (ref 3.5–5.0)
Alkaline Phosphatase: 107 U/L (ref 38–126)
Anion gap: 4 — ABNORMAL LOW (ref 5–15)
BUN: 12 mg/dL (ref 6–20)
CO2: 22 mmol/L (ref 22–32)
CREATININE: 0.69 mg/dL (ref 0.44–1.00)
Calcium: 7.4 mg/dL — ABNORMAL LOW (ref 8.9–10.3)
Chloride: 105 mmol/L (ref 98–111)
GFR calc Af Amer: >60 mL/min (ref >60)
GFR calc non Af Amer: >60 mL/min (ref >60)
Glucose, Bld: 137 mg/dL — ABNORMAL HIGH (ref 70–99)
Potassium: 3.7 mmol/L (ref 3.5–5.1)
Sodium: 131 mmol/L — ABNORMAL LOW (ref 135–145)
Total Bilirubin: 0.4 mg/dL (ref 0.3–1.2)
Total Protein: 5 g/dL — ABNORMAL LOW (ref 6.5–8.1)

## 2018-05-07 LAB — FETAL SCREEN: Fetal Screen: NEGATIVE

## 2018-05-07 MED ORDER — IBUPROFEN 600 MG PO TABS: 600.0000 mg | ORAL_TABLET | Freq: Four times a day (QID) | ORAL | Status: DC

## 2018-05-07 MED ORDER — SODIUM CHLORIDE 0.9 % IV BOLUS
500.0000 mL | Freq: Once | INTRAVENOUS | Status: AC
  Administered 2018-05-07: 500 mL via INTRAVENOUS

## 2018-05-07 MED ORDER — FERROUS SULFATE 325 (65 FE) MG PO TABS
325.0000 mg | ORAL_TABLET | Freq: Two times a day (BID) | ORAL | Status: DC
  Administered 2018-05-07 – 2018-05-09 (×4): 325 mg via ORAL
  Filled 2018-05-07 (×4): qty 1

## 2018-05-07 MED ORDER — RHO D IMMUNE GLOBULIN 1500 UNIT/2ML IJ SOSY
300.0000 ug | PREFILLED_SYRINGE | Freq: Once | INTRAMUSCULAR | Status: AC
  Administered 2018-05-07: 300 ug via INTRAVENOUS
  Filled 2018-05-07: qty 2

## 2018-05-07 MED ORDER — IBUPROFEN 600 MG PO TABS
600.0000 mg | ORAL_TABLET | Freq: Four times a day (QID) | ORAL | Status: DC
  Administered 2018-05-07 – 2018-05-09 (×8): 600 mg via ORAL
  Filled 2018-05-07 (×8): qty 1

## 2018-05-07 NOTE — Anesthesia Postprocedure Evaluation (Signed)
Anesthesia Post Note  Patient: Kathleen Scott  Procedure(s) Performed: CESAREAN SECTION (N/A )  Patient location during evaluation: Mother Baby Anesthesia Type: Epidural Level of consciousness: awake and alert Pain management: pain level controlled Vital Signs Assessment: post-procedure vital signs reviewed and stable Respiratory status: spontaneous breathing, nonlabored ventilation and respiratory function stable Cardiovascular status: stable and blood pressure returned to baseline Postop Assessment: no headache, no backache, epidural receding, patient able to bend at knees and no apparent nausea or vomiting Anesthetic complications: no     Last Vitals:  Vitals:   05/07/18 0433 05/07/18 0458  BP: 114/70 109/71  Pulse: 81 61  Resp: 18 20  Temp: 36.8 C   SpO2: 100% 98%    Last Pain:  Vitals:   05/07/18 0433  TempSrc: Oral  PainSc:                  [name]

## 2018-05-07 NOTE — Anesthesia Post-op Follow-up Note (Signed)
  Anesthesia Pain Follow-up Note  Patient: Kathleen Scott  Day #: 1  Date of Follow-up: 05/07/2018 Time: 8:02 AM  Last Vitals:  Vitals:   05/07/18 0433 05/07/18 0458  BP: 114/70 109/71  Pulse: 81 61  Resp: 18 20  Temp: 36.8 C   SpO2: 100% 98%    Level of Consciousness: alert  Pain: none   Side Effects:None  Catheter Site Exam:clean, dry, no drainage     Plan: D/C from anesthesia care at surgeon's request  Karoline Caldwelleana Maddox Bratcher

## 2018-05-07 NOTE — Progress Notes (Signed)
POD#1 pLTCS Subjective:  Sitting up in bed, experiencing some pain in her right shoulder. Foley in place with yellow urine output. Has been out of bed but has not ambulated in the hall yet.  Objective:  Blood pressure 115/79, pulse 81, temperature 97.9 F (36.6 C), temperature source Oral, resp. rate 20, height 5\' 6"  (1.676 m), weight 79.8 kg, last menstrual period 07/31/2017, SpO2 98 %, currently breastfeeding.  General: NAD Pulmonary: no increased work of breathing Abdomen: non-distended, non-tender, fundus firm, lochia appropriate Incision: Dressing C/D/I Extremities: no edema, no erythema, no tenderness  Results for orders placed or performed during the hospital encounter of 05/05/18 (from the past 72 hour(s))  CBC     Status: Abnormal   Collection Time: 05/05/18  9:55 AM  Result Value Ref Range   WBC 8.7 4.0 - 10.5 K/uL   RBC 3.29 (L) 3.87 - 5.11 MIL/uL   Hemoglobin 8.5 (L) 12.0 - 15.0 g/dL   HCT 59.2 (L) 92.4 - 46.2 %   MCV 82.4 80.0 - 100.0 fL   MCH 25.8 (L) 26.0 - 34.0 pg   MCHC 31.4 30.0 - 36.0 g/dL   RDW 86.3 81.7 - 71.1 %   Platelets 242 150 - 400 K/uL   nRBC 0.0 0.0 - 0.2 %    Comment: Performed at [facility], [address] [place], [place] [address]  RPR     Status: None   Collection Time: 05/05/18  9:55 AM  Result Value Ref Range   RPR Ser Ql Non Reactive Non Reactive    Comment: (NOTE) Performed At: [facility] [address] [place], [place] [id number] Jolene Schimke MD [id number]   Type and screen     Status: None   Collection Time: 05/05/18  9:55 AM  Result Value Ref Range   ABO/RH(D) A NEG    Antibody Screen POS    Sample Expiration 05/08/2018    Antibody Identification PASSIVELY ACQUIRED ANTI-D    Unit Number [id number]    Blood Component Type RBC LR PHER1    Unit division 00    Status of Unit REL FROM [facility]    Transfusion Status OK TO TRANSFUSE    Crossmatch Result      COMPATIBLE Performed at [facility],  [address] [place], [place] [address]    Unit Number [id number]    Blood Component Type RBC LR PHER1    Unit division 00    Status of Unit REL FROM [facility]    Transfusion Status OK TO TRANSFUSE    Crossmatch Result COMPATIBLE   Comprehensive metabolic panel     Status: Abnormal   Collection Time: 05/05/18  9:55 AM  Result Value Ref Range   Sodium 136 135 - 145 mmol/L   Potassium 4.0 3.5 - 5.1 mmol/L   Chloride 108 98 - 111 mmol/L   CO2 21 (L) 22 - 32 mmol/L   Glucose, Bld 96 70 - 99 mg/dL   BUN 12 6 - 20 mg/dL   Creatinine, Ser 1.11 0.44 - 1.00 mg/dL   Calcium 8.4 (L) 8.9 - 10.3 mg/dL   Total Protein 6.4 (L) 6.5 - 8.1 g/dL   Albumin 2.6 (L) 3.5 - 5.0 g/dL   AST 24 15 - 41 U/L   ALT 12 0 - 44 U/L   Alkaline Phosphatase 137 (H) 38 - 126 U/L   Total Bilirubin 0.5 0.3 - 1.2 mg/dL   GFR calc non Af Amer >60 >60 mL/min   GFR  calc Af Amer >60 >60 mL/min   Anion gap 7 5 - 15    Comment: Performed at [facility], [address] [place], [place] [address]  ABO/Rh     Status: None   Collection Time: 05/05/18 12:29 PM  Result Value Ref Range   ABO/RH(D)      A NEG Performed at [facility], [address] [place], [place] [address]   Protein / creatinine ratio, urine     Status: Abnormal   Collection Time: 05/05/18 12:30 PM  Result Value Ref Range   Creatinine, Urine 43 mg/dL   Total Protein, Urine 9 mg/dL    Comment: NO NORMAL RANGE ESTABLISHED FOR THIS TEST   Protein Creatinine Ratio 0.21 (H) 0.00 - 0.15 mg/mg[Cre]    Comment: Performed at [facility], [address] [place], [place] [address]  Fetal screen     Status: None   Collection Time: 05/07/18  2:43 AM  Result Value Ref Range   Fetal Screen      NEG Performed at [facility], [address] [place], [place] [address]   Rhogam injection     Status: None (Preliminary result)   Collection Time: 05/07/18  2:43 AM  Result Value Ref Range   Unit Number [id number]     Blood Component Type RHIG    Unit division 00    Status of Unit ALLOCATED    Transfusion Status OK TO TRANSFUSE   Comprehensive metabolic panel     Status: Abnormal   Collection Time: 05/07/18  2:43 AM  Result Value Ref Range   Sodium 131 (L) 135 - 145 mmol/L   Potassium 3.7 3.5 - 5.1 mmol/L   Chloride 105 98 - 111 mmol/L   CO2 22 22 - 32 mmol/L   Glucose, Bld 137 (H) 70 - 99 mg/dL   BUN 12 6 - 20 mg/dL   Creatinine, Ser 3.24 0.44 - 1.00 mg/dL   Calcium 7.4 (L) 8.9 - 10.3 mg/dL   Total Protein 5.0 (L) 6.5 - 8.1 g/dL   Albumin 2.1 (L) 3.5 - 5.0 g/dL   AST 30 15 - 41 U/L   ALT 13 0 - 44 U/L   Alkaline Phosphatase 107 38 - 126 U/L   Total Bilirubin 0.4 0.3 - 1.2 mg/dL   GFR calc non Af Amer >60 >60 mL/min   GFR calc Af Amer >60 >60 mL/min   Anion gap 4 (L) 5 - 15    Comment: Performed at [facility], [address] [place], [place] [address]  CBC     Status: Abnormal   Collection Time: 05/07/18  2:43 AM  Result Value Ref Range   WBC 11.6 (H) 4.0 - 10.5 K/uL   RBC 2.72 (L) 3.87 - 5.11 MIL/uL   Hemoglobin 7.0 (L) 12.0 - 15.0 g/dL   HCT 72.5 (L) 36.6 - 44.0 %   MCV 83.5 80.0 - 100.0 fL   MCH 25.7 (L) 26.0 - 34.0 pg   MCHC 30.8 30.0 - 36.0 g/dL   RDW 34.7 42.5 - 95.6 %   Platelets 201 150 - 400 K/uL   nRBC 0.0 0.0 - 0.2 %    Comment: Performed at [facility], [address] [place], [place] [address]  CBC     Status: Abnormal   Collection Time: 05/07/18  7:14 AM  Result Value Ref Range   WBC 12.3 (H) 4.0 - 10.5 K/uL   RBC 2.73 (L) 3.87 -  5.11 MIL/uL   Hemoglobin 7.2 (L) 12.0 - 15.0 g/dL   HCT 16.122.6 (L) 09.636.0 - 04.546.0 %   MCV 82.8 80.0 - 100.0 fL   MCH 26.4 26.0 - 34.0 pg   MCHC 31.9 30.0 - 36.0 g/dL   RDW 40.915.3 81.111.5 - 91.415.5 %   Platelets 198 150 - 400 K/uL   nRBC 0.0 0.0 - 0.2 %    Comment: Performed at [facility], [address] [place], [place] [id number]     Assessment:   33 y.o. G1P1001 postoperative day #1 in stable  condition.  Plan:  1) Acute blood loss anemia - hemodynamically stable and asymptomatic CBC recheck 7.2/22.6, no further decrease  - PO ferrous sulfate  2) Blood Type --/--/A NEG Performed at [facility], [address] [place], [place] [address]  [phone](02/01 1229) / Ishmael Holterubella 4.30 (07/03 1401) / Varicella Immune  Information for the patient's newborn:  Antony ContrasCentore, Boy Salley [[id number]][[id number]]  A POS - RhoGaM indicated  3) TDAP status: received antepartum 03/05/2018  4) Breastfeeding /Contraception: plans progesterone only pills  5) Disposition: encouraged ambulation, continue postpartum care, watch urine output.  Marcelyn BruinsJacelyn Vernel Donlan, CNM 05/07/2018

## 2018-05-07 NOTE — Lactation Note (Signed)
This note was copied from a baby's chart. Lactation Consultation Note  Patient Name: Kathleen Scott SWNIO'E Date: 05/07/2018   Observed breast feed.  Mom much more independent with latching in football hold and breastfeeding than yesterday.  [name] latches well with good rhythmic sucking.  Demonstrated how to massage breast to keep [name] actively sucking and swallowing at the breast and reminded to keep him close and not let him slip to tip of nipple.  Reviewed supply and demand, normal course of lactation and routine newborn feeding patterns.  Lactation name and number on white board and encouraged to call with any questions, concerns or assistance.    Maternal Data Formula Feeding for Exclusion: No  Feeding    LATCH Score                   Interventions    Lactation Tools Discussed/Used     Consult Status      Louis Meckel 05/07/2018, 10:35 PM

## 2018-05-07 NOTE — Progress Notes (Signed)
Pt output of on this shift with input by iv and po. Unable to palpate fundus. Had Engineering geologist and Rinaldo Cloud, RN to check fundus also. Pt abdomen seems tight BS present but slightly hypoactive throughout.C/O pain mostly on the right side at incision when laid back or pressed on. Bladder scanned and 390 scanned with foley still in place. Tried advancing slightly inflating deflating balloon and flushed twice no return except what was flushed. Removed foley, got pt up to bsc to see if could void. Pt was unable at this time.

## 2018-05-07 NOTE — Progress Notes (Signed)
Admit Date: 05/05/2018 Today's Date: 05/07/2018  Subjective: Postpartum Day 1: Cesarean Delivery Patient reports incisional pain that is mild and mostly at right pole of incision.  She is otherwise having minimal lower abdominal or uterine pain, and has min lochia.  Her UOP has been 30-50/hr during the night, with foley.  No hematuria.  Nurse concern for outflow or obstruction, replaced foley w failed brief bladder voiding trial in between.  Bladder scanner 300 mL. Breast normal at this time.  Mild appetite, no nausea.  No BM.   Objective: Vital signs in last 24 hours: Temp:  [97.9 F (36.6 C)-99.2 F (37.3 C)] 98.3 F (36.8 C) (02/03 0433) Pulse Rate:  [75-102] 81 (02/03 0433) Resp:  [12-30] 18 (02/03 0433) BP: (96-139)/(52-88) 114/70 (02/03 0433) SpO2:  [96 %-100 %] 100 % (02/03 0433)  Physical Exam:  General: alert, cooperative and no distress Lochia: appropriate Uterine Fundus: firm Incision: healing well, no significant drainage, no dehiscence, no significant erythema DVT Evaluation: No evidence of DVT seen on physical exam. Negative Homan's sign.  Recent Labs    05/05/18 0955 05/07/18 0243  HGB 8.5* 7.0*  HCT 27.1* 22.7*    Assessment/Plan: Status post Cesarean section. Lower UOP during night. Fluid bolus. Anemia noted.  Recheck 4 hours.  My exam does not suggest internal hemorrhage.  Bladder scan reading may be of OnQ fluid collecting in similar area (appropriate).  Will watch to see.  Recheck CBC at 0700.  Cont IVF, w foley to monitor UOP.  Infant doing well Breast feeding. Plans minipill for [facility] Ambulate as able  Kathleen Scott 05/07/2018, 4:43 AM

## 2018-05-08 LAB — RHOGAM INJECTION: Unit division: 0

## 2018-05-08 NOTE — Lactation Note (Signed)
This note was copied from a baby's chart. Lactation Consultation Note  Patient Name: Kathleen Scott AOZHY'Q Date: 05/08/2018 Reason for consult: Initial assessment   Maternal Data    Feeding Feeding Type: Breast Fed  LATCH Score Latch: Grasps breast easily, tongue down, lips flanged, rhythmical sucking.  Audible Swallowing: Spontaneous and intermittent  Type of Nipple: Everted at rest and after stimulation  Comfort (Breast/Nipple): Soft / non-tender  Hold (Positioning): Assistance needed to correctly position infant at breast and maintain latch.  LATCH Score: 9  Interventions Interventions: Breast feeding basics reviewed;Assisted with latch;Adjust position;Position options  Lactation Tools Discussed/Used     Consult Status Consult Status: Follow-up Date: 05/08/18 Follow-up type: In-patient Mother states that she is having difficulty with latching infant on the right side. LC assisted mother with positioning and encouraged her to place infant in a parallel position with ears parallel with hips and infant's belly turned toward her belly. Infant was able to immediately latch after position was corrected. Mother states that infant is feeding more often and LC explained that infant has started to clusterfeed which is expected. LC encouraged mother to switch breasts and make sure that infant's lips are flanged each time she latches him. Mother denies additional concerns at this time.   [name] 05/08/2018, 5:54 PM

## 2018-05-08 NOTE — Progress Notes (Signed)
POD #2 LTCS/ breech presentation Subjective:   Feeling well. Voiding without difficulty. Foley discontinued yesterday after urine output picked up. Denies lightheadedness when OOB. Passing flatus and tolerating a regular diet. Working on breastfeeding with lactation. Received Rhogam yesterday  Objective:  Blood pressure 123/70, pulse 79, temperature 98 F (36.7 C), temperature source Oral, resp. rate 18, height 5\' 6"  (1.676 m), weight 79.8 kg, last menstrual period 07/31/2017, SpO2 99 %, unknown if currently breastfeeding.  General: WF in NAD, sitting up in chair Pulmonary: no increased work of breathing/ CTAB Heart: irregular rhythm, but normal rate (extra beats periodically) and loud systolic murmur best heard at LSB and in her back when listening to lungs (h/o pulmonary valve stenosis) Abdomen: soft, appropriately tender, NABS, fundus firm at level of umbilicus-1 Incision: Honeycomb dressing C+D+I, ON Q intact Extremities: +1 pitting edema, no erythema, no tenderness  Results for orders placed or performed during the hospital encounter of 05/05/18 (from the past 72 hour(s))  ABO/Rh     Status: None   Collection Time: 05/05/18 12:29 PM  Result Value Ref Range   ABO/RH(D)      A NEG Performed at [facility], [address] [place], [place] [address]   Protein / creatinine ratio, urine     Status: Abnormal   Collection Time: 05/05/18 12:30 PM  Result Value Ref Range   Creatinine, Urine 43 mg/dL   Total Protein, Urine 9 mg/dL    Comment: NO NORMAL RANGE ESTABLISHED FOR THIS TEST   Protein Creatinine Ratio 0.21 (H) 0.00 - 0.15 mg/mg[Cre]    Comment: Performed at [facility], [address] [place], [place] [address]  Fetal screen     Status: None   Collection Time: 05/07/18  2:43 AM  Result Value Ref Range   Fetal Screen      NEG Performed at [facility], [address] [place], [place] [address]   Rhogam injection     Status: None   Collection Time: 05/07/18  2:43 AM  Result Value Ref Range   Unit Number [id number]    Blood Component Type RHIG    Unit division 00    Status of Unit ISSUED,FINAL    Transfusion Status      OK TO TRANSFUSE Performed at [facility], [address] [place], [place] [address]   Comprehensive metabolic panel     Status: Abnormal   Collection Time: 05/07/18  2:43 AM  Result Value Ref Range   Sodium 131 (L) 135 - 145 mmol/L   Potassium 3.7 3.5 - 5.1 mmol/L   Chloride 105 98 - 111 mmol/L   CO2 22 22 - 32 mmol/L   Glucose, Bld 137 (H) 70 - 99 mg/dL   BUN 12 6 - 20 mg/dL   Creatinine, Ser 1.02 0.44 - 1.00 mg/dL   Calcium 7.4 (L) 8.9 - 10.3 mg/dL   Total Protein 5.0 (L) 6.5 - 8.1 g/dL   Albumin 2.1 (L) 3.5 - 5.0 g/dL   AST 30 15 - 41 U/L   ALT 13 0 - 44 U/L   Alkaline Phosphatase 107 38 - 126 U/L   Total Bilirubin 0.4 0.3 - 1.2 mg/dL   GFR calc non Af Amer >60 >60 mL/min   GFR calc Af Amer >60 >60 mL/min   Anion gap 4 (L) 5 - 15    Comment: Performed at [facility], [address], [place], [place] [address]  CBC     Status: Abnormal  Collection Time: 05/07/18  2:43 AM  Result Value Ref Range   WBC 11.6 (H) 4.0 - 10.5 K/uL   RBC 2.72 (L) 3.87 - 5.11 MIL/uL   Hemoglobin 7.0 (L) 12.0 - 15.0 g/dL   HCT 20.1 (L) 00.7 - 12.1 %   MCV 83.5 80.0 - 100.0 fL   MCH 25.7 (L) 26.0 - 34.0 pg   MCHC 30.8 30.0 - 36.0 g/dL   RDW 97.5 88.3 - 25.4 %   Platelets 201 150 - 400 K/uL   nRBC 0.0 0.0 - 0.2 %    Comment: Performed at [facility], [address] [place], [place] [address]  CBC     Status: Abnormal   Collection Time: 05/07/18  7:14 AM  Result Value Ref Range   WBC 12.3 (H) 4.0 - 10.5 K/uL   RBC 2.73 (L) 3.87 - 5.11 MIL/uL   Hemoglobin 7.2 (L) 12.0 - 15.0 g/dL   HCT 15.8 (L) 30.9 - 40.7 %   MCV 82.8 80.0 - 100.0 fL   MCH 26.4 26.0 - 34.0 pg   MCHC 31.9 30.0 - 36.0 g/dL   RDW 68.0 88.1 - 10.3 %   Platelets 198 150 - 400 K/uL   nRBC 0.0 0.0 -  0.2 %    Comment: Performed at [facility], [address], [place], [place] [address]   Information for the patient's newborn:  Kellen, [place] [[id number]]  A POS   Assessment:   33 y.o. G1P1001 postoperativeday # 2-stable  Continue postoperative and postpartum care  Lactation support   Plan:  1) Chronic anemia worsened with acute blood loss anemia - hemodynamically stable and asymptomatic - po ferrous sulfate and vitamins  2) A negative and baby A positive-received Rhogam yesterday/ RI/VI  3) TDAP UTD  4) Breast/minipill  5) Disposition-probable discharge tomorrow  Farrel Conners, CNM

## 2018-05-09 MED ORDER — NORETHINDRONE 0.35 MG PO TABS: 1.0000 | ORAL_TABLET | Freq: Every day | ORAL | 11 refills | Status: DC

## 2018-05-09 MED ORDER — DOCUSATE SODIUM 100 MG PO CAPS: 100.0000 mg | ORAL_CAPSULE | Freq: Two times a day (BID) | ORAL | 1 refills | Status: AC | PRN

## 2018-05-09 MED ORDER — FERROUS SULFATE 325 (65 FE) MG PO TABS: 325.0000 mg | ORAL_TABLET | Freq: Two times a day (BID) | ORAL | 1 refills | Status: DC

## 2018-05-09 MED ORDER — IBUPROFEN 600 MG PO TABS: 600.0000 mg | ORAL_TABLET | Freq: Four times a day (QID) | ORAL | 0 refills | Status: DC

## 2018-05-09 NOTE — Lactation Note (Signed)
This note was copied from a baby's chart. Lactation Consultation Note  Patient Name: Kathleen Scott BPZWC'H Date: 05/09/2018 Reason for consult: Follow-up assessment   Maternal Data    Feeding    LATCH Score                   Interventions    Lactation Tools Discussed/Used     Consult Status Consult Status: Complete Follow-up type: Call as needed  LC f/u with family before d/c to ensure all breastfeeding questions/concerns have been addressed. Parents state that nursing is going well and baby is showing signs of fullness after feedings. Family has been instructed to reach out to [facility] Dept. If necessary. LC also told parents about breastfeeding support group.   Burnadette Peter 05/09/2018, 11:19 AM

## 2018-05-09 NOTE — Progress Notes (Signed)
Patient discharged home with infant. Discharge instructions and prescriptions given and reviewed with patient. Patient verbalized understanding. Escorted out by auxillary.  

## 2018-05-10 ENCOUNTER — Telehealth: Payer: Self-pay

## 2018-05-10 ENCOUNTER — Encounter: Payer: Self-pay | Admitting: Obstetrics & Gynecology

## 2018-05-10 ENCOUNTER — Other Ambulatory Visit: Payer: Self-pay | Admitting: Obstetrics & Gynecology

## 2018-05-10 MED ORDER — OXYCODONE-ACETAMINOPHEN 5-325 MG PO TABS: 1.0000 | ORAL_TABLET | Freq: Four times a day (QID) | ORAL | 0 refills | Status: DC | PRN

## 2018-05-10 NOTE — Telephone Encounter (Signed)
Pt calling nurse line stating rx for percocet is not on her d/c papers.  Called her pharm.  They have not gotten her rx and sugg it be eRx'd.  However, it will only print.  Should I adv pt to come p/u rx or can PH get it to eRx?

## 2018-05-11 NOTE — Telephone Encounter (Signed)
Ive done it yesterday

## 2018-05-14 ENCOUNTER — Encounter: Payer: Self-pay | Admitting: Obstetrics & Gynecology

## 2018-05-14 ENCOUNTER — Ambulatory Visit (INDEPENDENT_AMBULATORY_CARE_PROVIDER_SITE_OTHER): Payer: BC Managed Care – PPO | Admitting: Obstetrics & Gynecology

## 2018-05-14 NOTE — Progress Notes (Signed)
  Postoperative Follow-up Patient presents post op from CS for FTP and breech, 10 days ago.  Subjective: Patient reports marked improvement in her post op pain. Eating a regular diet without difficulty. The patient is not having any pain. Constipation, so stopped Percocet. Activity: normal activities of daily living but no lifting or driving yet. Patient reports additional symptom's since surgery of min lochia.  Breast feeding well., Infant doing well.  Edema lower extremities.  Objective: BP 130/80   Ht 5\' 6"  (1.676 m)   Wt 165 lb (74.8 kg)   BMI 26.63 kg/m  Physical Exam Constitutional:      General: She is not in acute distress.    Appearance: She is well-developed.  Cardiovascular:     Rate and Rhythm: Normal rate.  Pulmonary:     Effort: Pulmonary effort is normal.  Abdominal:     General: There is no distension.     Palpations: Abdomen is soft.     Tenderness: There is no abdominal tenderness.     Comments: Incision Healing Well   Musculoskeletal: Normal range of motion.  Neurological:     Mental Status: She is alert and oriented to person, place, and time.     Cranial Nerves: No cranial nerve deficit.  Skin:    General: Skin is warm and dry.   Assessment: s/p :  cesarean section progressing well  Plan: Patient has done well after surgery with no apparent complications.  I have discussed the post-operative course to date, and the expected progress moving forward.  The patient understands what complications to be concerned about.  I will see the patient in routine follow up, or sooner if needed.    Activity plan: No heavy lifting.  Pelvic rest. Plans Progesterone only pill  Letitia Libra 05/14/2018, 11:54 AM

## 2018-06-11 ENCOUNTER — Encounter: Payer: Self-pay | Admitting: Obstetrics & Gynecology

## 2018-06-11 ENCOUNTER — Ambulatory Visit (INDEPENDENT_AMBULATORY_CARE_PROVIDER_SITE_OTHER): Payer: BC Managed Care – PPO | Admitting: Obstetrics & Gynecology

## 2018-06-11 DIAGNOSIS — Z1389 Encounter for screening for other disorder: Secondary | ICD-10-CM | POA: Diagnosis not present

## 2018-06-11 NOTE — Progress Notes (Signed)
  OBSTETRICS POSTPARTUM CLINIC PROGRESS NOTE  Subjective:     Kathleen Scott is a 33 y.o. G63P1001 female who presents for a postpartum visit. She is 6 weeks postpartum following a Term pregnancy and delivery by C-section.  I have fully reviewed the prenatal and intrapartum course. Anesthesia: epidural.  Postpartum course has been complicated by uncomplicated.  Baby is feeding by Breast.  Bleeding: patient has not  resumed menses.  Bowel function is normal. Bladder function is normal.  Patient is not sexually active. Contraception method desired is oral progesterone-only contraceptive.  Postpartum depression screening: negative. Edinburgh 0.  The following portions of the patient's history were reviewed and updated as appropriate: allergies, current medications, past family history, past medical history, past social history, past surgical history and problem list.  Review of Systems Pertinent items are noted in HPI.  Objective:    BP 120/80   Ht 5\' 6"  (1.676 m)   Wt 146 lb (66.2 kg)   BMI 23.57 kg/m   General:  alert and no distress   Breasts:  inspection negative, no nipple discharge or bleeding, no masses or nodularity palpable  Lungs: clear to auscultation bilaterally  Heart:  regular rate and rhythm, S1, S2 normal, no murmur, click, rub or gallop  Abdomen: soft, non-tender; bowel sounds normal; no masses,  no organomegaly.  Well healed Pfannenstiel incision   Vulva:  normal  Vagina: normal vagina, no discharge, exudate, lesion, or erythema  Cervix:  no cervical motion tenderness and no lesions  Corpus: normal size, contour, position, consistency, mobility, non-tender  Adnexa:  normal adnexa and no mass, fullness, tenderness  Rectal Exam: Not performed.        Assessment:  Post Partum Care visit 1. Postpartum care following cesarean delivery  Plan:  See orders and Patient Instructions Contraceptive counseling for oral progesterone-only contraceptive Follow up in: 4  months or as needed.   PAP due July  Annamarie Major, MD, [facility], [facility] 06/11/2018  11:44 AM

## 2018-06-25 ENCOUNTER — Encounter: Payer: Self-pay | Admitting: Obstetrics & Gynecology

## 2018-09-17 ENCOUNTER — Encounter: Payer: Self-pay | Admitting: Obstetrics & Gynecology

## 2018-09-17 ENCOUNTER — Other Ambulatory Visit: Payer: Self-pay | Admitting: Obstetrics & Gynecology

## 2018-09-17 MED ORDER — NORETHIN ACE-ETH ESTRAD-FE 1-20 MG-MCG(24) PO TABS: 1.0000 | ORAL_TABLET | Freq: Every day | ORAL | 3 refills | Status: DC

## 2018-09-17 NOTE — Telephone Encounter (Signed)
Please advise 

## 2018-09-17 NOTE — Telephone Encounter (Signed)
Sch Annual in July

## 2019-06-20 ENCOUNTER — Encounter: Payer: Self-pay | Admitting: Cardiovascular Disease

## 2019-06-20 ENCOUNTER — Ambulatory Visit (INDEPENDENT_AMBULATORY_CARE_PROVIDER_SITE_OTHER): Payer: BC Managed Care – PPO | Admitting: Cardiovascular Disease

## 2019-06-20 ENCOUNTER — Other Ambulatory Visit: Payer: Self-pay

## 2019-06-20 VITALS — BP 118/60 | HR 83 | Ht 66.0 in | Wt 141.0 lb

## 2019-06-20 DIAGNOSIS — I379 Nonrheumatic pulmonary valve disorder, unspecified: Secondary | ICD-10-CM | POA: Diagnosis not present

## 2019-06-20 NOTE — Patient Instructions (Signed)
Medication Instructions:  Your physician recommends that you continue on your current medications as directed. Please refer to the Current Medication list given to you today.  *If you need a refill on your cardiac medications before your next appointment, please call your pharmacy*   Lab Work: None ordered If you have labs (blood work) drawn today and your tests are completely normal, you will receive your results only by: Marland Kitchen MyChart Message (if you have MyChart) OR . A paper copy in the mail If you have any lab test that is abnormal or we need to change your treatment, we will call you to review the results.   Testing/Procedures: Your physician has requested that you have an echocardiogram. Echocardiography is a painless test that uses sound waves to create images of your heart. It provides your doctor with information about the size and shape of your heart and how well your heart's chambers and valves are working. This procedure takes approximately one hour. There are no restrictions for this procedure.     Follow-Up: At [facility], you and your health needs are our priority.  As part of our continuing mission to provide you with exceptional heart care, we have created designated Provider Care Teams.  These Care Teams include your primary Cardiologist (physician) and Advanced Practice Providers (APPs -  Physician Assistants and Nurse Practitioners) who all work together to provide you with the care you need, when you need it.  We recommend signing up for the patient portal called "MyChart".  Sign up information is provided on this After Visit Summary.  MyChart is used to connect with patients for Virtual Visits (Telemedicine).  Patients are able to view lab/test results, encounter notes, upcoming appointments, etc.  Non-urgent messages can be sent to your provider as well.   To learn more about what you can do with MyChart, go to [email or web address].    Your next appointment:   12  month(s)  The format for your next appointment:   In Person  Provider:    You may see Lorine Bears, MD or one of the following Advanced Practice Providers on your designated Care Team:    Nicolasa Ducking, NP  Eula Listen, PA-C  Marisue Ivan, PA-C    Other Instructions  Echocardiogram An echocardiogram is a procedure that uses painless sound waves (ultrasound) to produce an image of the heart. Images from an echocardiogram can provide important information about:  Signs of coronary artery disease (CAD).  Aneurysm detection. An aneurysm is a weak or damaged part of an artery wall that bulges out from the normal force of blood pumping through the body.  Heart size and shape. Changes in the size or shape of the heart can be associated with certain conditions, including heart failure, aneurysm, and CAD.  Heart muscle function.  Heart valve function.  Signs of a past heart attack.  Fluid buildup around the heart.  Thickening of the heart muscle.  A tumor or infectious growth around the heart valves. Tell a health care provider about:  Any allergies you have.  All medicines you are taking, including vitamins, herbs, eye drops, creams, and over-the-counter medicines.  Any blood disorders you have.  Any surgeries you have had.  Any medical conditions you have.  Whether you are pregnant or may be pregnant. What are the risks? Generally, this is a safe procedure. However, problems may occur, including:  Allergic reaction to dye (contrast) that may be used during the procedure. What happens before the procedure? No  specific preparation is needed. You may eat and drink normally. What happens during the procedure?   An IV tube may be inserted into one of your veins.  You may receive contrast through this tube. A contrast is an injection that improves the quality of the pictures from your heart.  A gel will be applied to your chest.  A wand-like tool  (transducer) will be moved over your chest. The gel will help to transmit the sound waves from the transducer.  The sound waves will harmlessly bounce off of your heart to allow the heart images to be captured in real-time motion. The images will be recorded on a computer. The procedure may vary among health care providers and hospitals. What happens after the procedure?  You may return to your normal, everyday life, including diet, activities, and medicines, unless your health care provider tells you not to do that. Summary  An echocardiogram is a procedure that uses painless sound waves (ultrasound) to produce an image of the heart.  Images from an echocardiogram can provide important information about the size and shape of your heart, heart muscle function, heart valve function, and fluid buildup around your heart.  You do not need to do anything to prepare before this procedure. You may eat and drink normally.  After the echocardiogram is completed, you may return to your normal, everyday life, unless your health care provider tells you not to do that. This information is not intended to replace advice given to you by your health care provider. Make sure you discuss any questions you have with your health care provider. Document Revised: 07/12/2018 Document Reviewed: 04/23/2016 Elsevier Patient Education  [place].

## 2019-06-20 NOTE — Progress Notes (Signed)
Cardiology Office Note   Date:  06/20/2019   ID:  Kathleen Scott, DOB [birth year], MRN [id number]  PCP:  Elby Beck, FNP  Cardiologist:   Kathlyn Sacramento, MD   Chief Complaint  Patient presents with  . other    3 month follow up. Meds reviewed by the pt. verbally. "doing well."       History of Present Illness: Kathleen Scott is a 34 y.o. female who presents for a follow up visit.  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  She has known moderate pulmonary valve regurgitation with mild RV enlargement.  She continues to work as a Pharmacist, hospital. She delivered a healthy baby via C-section in February of last year.  Most recent echocardiogram in July 2019 showed an EF of 55 to 60%, moderately dilated right ventricle with mildly reduced systolic function.  There was at least moderate pulmonary valve regurgitation.  Pulmonary pressure was mildly elevated at 30 to 35 mmHg.  She has been doing extremely well with no chest pain, shortness of breath or palpitations.  She goes to the gym 3 times a week and does [facility] for at least an hour each time.  The workout can be very intense and she has no physical limitations.  Past Medical History:  Diagnosis Date  . Congenital Pulmonic Stenosis    a. 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 06/2015 Echo: EF 50-55%, septal wall HK 2/2 post-op state. Mild MR. Mildly dil RV size. Nl RV fxn. Triv PR. Nl PASP.  Marland Kitchen Heart murmur   . Syncope and collapse     Past Surgical History:  Procedure Laterality Date  . CESAREAN SECTION N/A 05/06/2018   Procedure: CESAREAN SECTION;  Surgeon: Gae Dry, MD;  Location: [facility] ORS;  Service: Obstetrics;  Laterality: N/A;  . open heart surgery     [facility]     Current Outpatient Medications  Medication Sig Dispense Refill  . Norethindrone Acetate-Ethinyl  Estrad-FE (LOESTRIN 24 FE) 1-20 MG-MCG(24) tablet Take 1 tablet by mouth daily. 3 Package 3   No current facility-administered medications for this visit.    Allergies:   Patient has no known allergies.    Social History:  The patient  reports that she has never smoked. She has never used smokeless tobacco. She reports previous alcohol use. She reports that she does not use drugs.   Family History:  The patient's Family history is unknown by patient.    ROS:  Please see the history of present illness.   Otherwise, review of systems are positive for none.   All other systems are reviewed and negative.    PHYSICAL EXAM: VS:  BP 118/60 (BP Location: Left Arm, Patient Position: Sitting, Cuff Size: Normal)   Pulse 83   Ht 5\' 6"  (1.676 m)   Wt 141 lb (64 kg)   SpO2 99%   BMI 22.76 kg/m  , BMI Body mass index is 22.76 kg/m. GEN: Well nourished, well developed, in no acute distress  HEENT: normal  Neck: no JVD, carotid bruits, or masses Cardiac: RRR; there is a 2/6 diastolic decrescendo murmur in the pulmonic area .  There is also a right-sided S3 which seems to be new. Respiratory:  clear to auscultation bilaterally, normal work of breathing GI: soft, nontender, nondistended, + BS MS: no deformity or atrophy  Skin: warm and dry, no rash Neuro:  Strength and sensation are intact Psych: euthymic mood, full affect   EKG:  EKG is ordered today. The ekg ordered today demonstrates sinus rhythm with sinus arrhythmia.  Incomplete right bundle branch block.   Recent Labs: No results found for requested labs within last 8760 hours.    Lipid Panel No results found for: CHOL, TRIG, HDL, CHOLHDL, VLDL, LDLCALC, LDLDIRECT    Wt Readings from Last 3 Encounters:  06/20/19 141 lb (64 kg)  06/11/18 146 lb (66.2 kg)  05/14/18 165 lb (74.8 kg)        ASSESSMENT AND PLAN:  1.  Pulmonary valve disease: The patient has history of surgical repair of pulmonary valve stenosis. She is known  to have moderate to severe pulmonary valve regurgitation.  Most recent echocardiogram showed more enlargement of the right ventricle compared to before with mild reduced systolic function.  She is completely asymptomatic even at high workload.  She does have right-sided S3 which seems to be new compared to before.  I am going to repeat her echocardiogram and consider referral to the [facility] adult congenital heart disease clinic.  If echo is conclusive, cardiac MRI should be done next to evaluate RV size and function.  According to up-to-date, these are the indications for pulmonary valve replacement:  .We recommend surgical pulmonary valve replacement for symptomatic, severe PR. .We suggest pulmonary valve replacement for asymptomatic severe PR when any two of the following criteria are present: (1) mild or moderate RV or LV systolic dysfunction, (2) severe RV dilation (RV end-diastolic volume index [RVEDVI] ?160 mL/m2, or RV end-systolic volume index [RVESVI] ?80 mL/m2, or RVEDV greater than or equal to two times the LV end-diastolic volume), (3) RV systolic pressure due to RV outflow tract obstruction greater than or equal to two-thirds the systemic pressure, and (4) progressive reduction in objective exercise tolerance  .We suggest pulmonary valve replacement for patients with severe PR and progressive tricuspid valve regurgitation, although evidence is more limited to support this approach.    Disposition:   FU with me in 1 year.   Signed,  Lorine Bears, MD  06/20/2019 2:59 PM    [facility]

## 2019-08-01 ENCOUNTER — Ambulatory Visit (INDEPENDENT_AMBULATORY_CARE_PROVIDER_SITE_OTHER): Payer: BC Managed Care – PPO

## 2019-08-01 ENCOUNTER — Other Ambulatory Visit: Payer: Self-pay

## 2019-08-01 DIAGNOSIS — I379 Nonrheumatic pulmonary valve disorder, unspecified: Secondary | ICD-10-CM

## 2019-08-26 ENCOUNTER — Other Ambulatory Visit: Payer: Self-pay | Admitting: Obstetrics & Gynecology

## 2019-08-26 NOTE — Telephone Encounter (Signed)
Sch Annual Refill x1 done until she can have exam

## 2019-08-28 ENCOUNTER — Encounter: Payer: Self-pay | Admitting: Family Medicine

## 2019-08-29 ENCOUNTER — Telehealth: Payer: Self-pay

## 2019-08-29 ENCOUNTER — Telehealth: Payer: BC Managed Care – PPO | Admitting: Family Medicine

## 2019-08-29 NOTE — Telephone Encounter (Signed)
[facility] Night - Client Nonclinical Telephone Record AccessNurse Client [facility] Night - Client Client Site [facility] - Night Physician Roxy Manns - MD Contact Type Call Who Is Calling Patient / Member / Family / Caregiver Caller Name Dakiya Puopolo Caller Phone Number [phone] Patient Name Kathleen Scott Patient DOB 1985-07-03 Call Type Message Only Information Provided Reason for Call Request to [facility] Appointment Initial Comment Caller states is needing to cancel appt that was made for tomorrow at 8AM. Additional Comment Disp. Time Disposition Final User 08/28/2019 8:52:11 PM General Information Provided Yes Macario Carls Call Closed By: Macario Carls Transaction Date/Time: 08/28/2019 8:50:17 PM (ET)

## 2019-08-29 NOTE — Telephone Encounter (Signed)
?   Appears virtual appt is going on now.

## 2019-08-29 NOTE — Progress Notes (Signed)
Virtual Visit via Video Note  I connected with Kathleen Scott on 08/29/19 at  8:00 AM EDT by a video enabled telemedicine application and verified that I am speaking with the correct person using two identifiers.  Location:     I discussed the limitations of evaluation and management by telemedicine and the availability of in person appointments. The patient expressed understanding and agreed to proceed.  Parties involved in encounter  Patient:  Provider:  Roxy Manns MD    History of Present Illness:   patient cancelled this appt.  Observations/Objective:   Assessment and Plan:   Follow Up Instructions:    I discussed the assessment and treatment plan with the patient. The patient was provided an opportunity to ask questions and all were answered. The patient agreed with the plan and demonstrated an understanding of the instructions.   The patient was advised to call back or seek an in-person evaluation if the symptoms worsen or if the condition fails to improve as anticipated.     Roxy Manns, MD

## 2019-08-30 DIAGNOSIS — R0981 Nasal congestion: Secondary | ICD-10-CM | POA: Insufficient documentation

## 2019-09-21 ENCOUNTER — Other Ambulatory Visit: Payer: Self-pay | Admitting: Obstetrics & Gynecology

## 2019-10-11 ENCOUNTER — Encounter: Payer: Self-pay | Admitting: Obstetrics & Gynecology

## 2019-10-11 ENCOUNTER — Other Ambulatory Visit (HOSPITAL_COMMUNITY)
Admission: RE | Admit: 2019-10-11 | Discharge: 2019-10-11 | Disposition: A | Discharge status: Home / Self Care | Payer: BC Managed Care – PPO | Source: Ambulatory Visit | Attending: Obstetrics & Gynecology | Admitting: Obstetrics & Gynecology

## 2019-10-11 ENCOUNTER — Ambulatory Visit (INDEPENDENT_AMBULATORY_CARE_PROVIDER_SITE_OTHER): Payer: BC Managed Care – PPO | Admitting: Obstetrics & Gynecology

## 2019-10-11 ENCOUNTER — Other Ambulatory Visit: Payer: Self-pay

## 2019-10-11 VITALS — BP 120/80 | Ht 66.0 in | Wt 132.0 lb

## 2019-10-11 DIAGNOSIS — Z01419 Encounter for gynecological examination (general) (routine) without abnormal findings: Secondary | ICD-10-CM

## 2019-10-11 DIAGNOSIS — Z1329 Encounter for screening for other suspected endocrine disorder: Secondary | ICD-10-CM

## 2019-10-11 DIAGNOSIS — Z124 Encounter for screening for malignant neoplasm of cervix: Secondary | ICD-10-CM

## 2019-10-11 DIAGNOSIS — Z1321 Encounter for screening for nutritional disorder: Secondary | ICD-10-CM

## 2019-10-11 DIAGNOSIS — Z3041 Encounter for surveillance of contraceptive pills: Secondary | ICD-10-CM | POA: Diagnosis not present

## 2019-10-11 DIAGNOSIS — Z131 Encounter for screening for diabetes mellitus: Secondary | ICD-10-CM

## 2019-10-11 DIAGNOSIS — Z13220 Encounter for screening for lipoid disorders: Secondary | ICD-10-CM

## 2019-10-11 NOTE — Patient Instructions (Addendum)
Thank you for choosing [facility]. As part of our ongoing efforts to improve patient experience, we would appreciate your feedback. Please fill out the short survey that you will receive by mail or MyChart. Your opinion is important to [place]! -Dr Tiburcio Pea  PAP every three years Labs yearly

## 2019-10-11 NOTE — Progress Notes (Signed)
HPI:      [name] is a 34 y.o. G1P1001 who LMP was Patient's last menstrual period was 10/03/2019., she presents today for her annual examination. The patient has no complaints today. The patient is sexually active. Her last pap: approximate date 2019 and was normal. The patient does perform self breast exams.  There is no notable family history of breast or ovarian cancer in her family.  The patient has regular exercise: yes.  The patient denies current symptoms of depression.    GYN History: Contraception: OCP (estrogen/progesterone)    Periods start early (third week and are prolonged), for the last 11 mos (this month first one on time normally)    Took ABX last month.  Neg preg test.  Used precautions.   PMHx: Past Medical History:  Diagnosis Date  . Congenital Pulmonic Stenosis    a. 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 06/2015 Echo: EF 50-55%, septal wall HK 2/2 post-op state. Mild MR. Mildly dil RV size. Nl RV fxn. Triv PR. Nl PASP.  Marland Kitchen Heart murmur   . Syncope and collapse    Past Surgical History:  Procedure Laterality Date  . CESAREAN SECTION N/A 05/06/2018   Procedure: CESAREAN SECTION;  Surgeon: Nadara Mustard, MD;  Location: [facility] ORS;  Service: Obstetrics;  Laterality: N/A;  . open heart surgery     [facility]   Family History  Family history unknown: Yes   Social History   Tobacco Use  . Smoking status: Never Smoker  . Smokeless tobacco: Never Used  Vaping Use  . Vaping Use: Never used  Substance Use Topics  . Alcohol use: Not Currently  . Drug use: No    Current Outpatient Medications:  .  norethindrone-ethinyl estradiol (LOESTRIN FE) 1-20 MG-MCG tablet, TAKE 1 TABLET BY MOUTH ONCE DAILY, Disp: 28 tablet, Rfl: 0 Allergies: Patient has no known allergies.  Review of Systems  Constitutional: Negative for chills, fever and malaise/fatigue.  HENT: Negative for congestion, sinus pain and sore  throat.   Eyes: Negative for blurred vision and pain.  Respiratory: Negative for cough and wheezing.   Cardiovascular: Negative for chest pain and leg swelling.  Gastrointestinal: Negative for abdominal pain, constipation, diarrhea, heartburn, nausea and vomiting.  Genitourinary: Negative for dysuria, frequency, hematuria and urgency.  Musculoskeletal: Negative for back pain, joint pain, myalgias and neck pain.  Skin: Negative for itching and rash.  Neurological: Negative for dizziness, tremors and weakness.  Endo/Heme/Allergies: Does not bruise/bleed easily.  Psychiatric/Behavioral: Negative for depression. The patient is not nervous/anxious and does not have insomnia.     Objective: BP 120/80   Ht 5\' 6"  (1.676 m)   Wt 132 lb (59.9 kg)   LMP 10/03/2019   BMI 21.31 kg/m   Filed Weights   10/11/19 0804  Weight: 132 lb (59.9 kg)   Body mass index is 21.31 kg/m. Physical Exam Constitutional:      General: She is not in acute distress.    Appearance: She is well-developed.  Genitourinary:     Pelvic exam was performed with patient supine.     Vagina, uterus and rectum normal.     No lesions in the vagina.     No vaginal bleeding.     No cervical motion tenderness, friability, lesion or polyp.     Uterus is mobile.     Uterus is not enlarged.     No uterine mass detected.  Uterus is midaxial.     No right or left adnexal mass present.     Right adnexa not tender.     Left adnexa not tender.  HENT:     Head: Normocephalic and atraumatic. No laceration.     Right Ear: Hearing normal.     Left Ear: Hearing normal.     Mouth/Throat:     Pharynx: Uvula midline.  Eyes:     Pupils: Pupils are equal, round, and reactive to light.  Neck:     Thyroid: No thyromegaly.  Cardiovascular:     Rate and Rhythm: Normal rate and regular rhythm.     Heart sounds: No murmur heard.  No friction rub. No gallop.   Pulmonary:     Effort: Pulmonary effort is normal. No respiratory  distress.     Breath sounds: Normal breath sounds. No wheezing.  Chest:     Breasts:        Right: No mass, skin change or tenderness.        Left: No mass, skin change or tenderness.  Abdominal:     General: Bowel sounds are normal. There is no distension.     Palpations: Abdomen is soft.     Tenderness: There is no abdominal tenderness. There is no rebound.  Musculoskeletal:        General: Normal range of motion.     Cervical back: Normal range of motion and neck supple.  Neurological:     Mental Status: She is alert and oriented to person, place, and time.     Cranial Nerves: No cranial nerve deficit.  Skin:    General: Skin is warm and dry.  Psychiatric:        Judgment: Judgment normal.  Vitals reviewed.     Assessment:  ANNUAL EXAM 1. Women's annual routine gynecological examination   2. Screening for cervical cancer   3. Encounter for surveillance of contraceptive pills      Screening Plan:            1.  Cervical Screening-  Pap smear done today  2. Breast screening- Exam annually and mammogram>40 planned   3. Labs Ordered today  4. Counseling for contraception: Will cont OCP for now, consideration of alternatives discussed.  She would either prefer change ion OCP brand/type vs IUD.  Will call when decided she wants a change to arrange.       F/U  Return in about 1 year (around 10/10/2020) for Annual.  Annamarie Major, MD, [facility], [facility] 10/11/2019  8:11 AM

## 2019-10-12 LAB — VITAMIN D 25 HYDROXY (VIT D DEFICIENCY, FRACTURES): Vit D, 25-Hydroxy: 35.1 ng/mL (ref 30.0–100.0)

## 2019-10-12 LAB — LIPID PANEL
Chol/HDL Ratio: 3 ratio (ref 0.0–4.4)
Cholesterol, Total: 161 mg/dL (ref 100–199)
HDL: 54 mg/dL (ref >39)
LDL Chol Calc (NIH): 88 mg/dL (ref 0–99)
Triglycerides: 102 mg/dL (ref 0–149)
VLDL Cholesterol Cal: 19 mg/dL (ref 5–40)

## 2019-10-12 LAB — GLUCOSE, FASTING: Glucose, Plasma: 86 mg/dL (ref 65–99)

## 2019-10-12 LAB — TSH: TSH: 3.06 u[IU]/mL (ref 0.450–4.500)

## 2019-10-14 LAB — CYTOLOGY - PAP
Comment: NEGATIVE
Diagnosis: NEGATIVE
High risk HPV: NEGATIVE

## 2019-10-29 ENCOUNTER — Telehealth: Payer: Self-pay | Admitting: Obstetrics & Gynecology

## 2019-10-29 NOTE — Telephone Encounter (Signed)
Patient is schedule for 11/12/19 at 4:10 with [facility] for Mirena

## 2019-11-08 ENCOUNTER — Other Ambulatory Visit: Payer: Self-pay | Admitting: Obstetrics & Gynecology

## 2019-11-12 ENCOUNTER — Ambulatory Visit (INDEPENDENT_AMBULATORY_CARE_PROVIDER_SITE_OTHER): Payer: BC Managed Care – PPO | Admitting: Obstetrics & Gynecology

## 2019-11-12 ENCOUNTER — Other Ambulatory Visit: Payer: Self-pay

## 2019-11-12 ENCOUNTER — Encounter: Payer: Self-pay | Admitting: Obstetrics & Gynecology

## 2019-11-12 VITALS — BP 110/70 | Ht 66.0 in | Wt 134.2 lb

## 2019-11-12 DIAGNOSIS — Z30430 Encounter for insertion of intrauterine contraceptive device: Secondary | ICD-10-CM | POA: Diagnosis not present

## 2019-11-12 DIAGNOSIS — Z3041 Encounter for surveillance of contraceptive pills: Secondary | ICD-10-CM

## 2019-11-12 NOTE — Progress Notes (Signed)
  IUD PROCEDURE NOTE:  Kathleen Scott is a 34 y.o. G1P1001 here for IUD insertion. No GYN concerns.  Last pap smear was normal. Changing from OCP to IUD for period control and birth control.  IUD Insertion Procedure Note Patient identified, informed consent performed, consent signed.   Discussed risks of irregular bleeding, cramping, infection, malpositioning or misplacement of the IUD outside the uterus which may require further procedure such as laparoscopy, risk of failure <1%. Time out was performed.  Urine pregnancy test negative.  A bimanual exam showed the uterus to be anteverted.  Speculum placed in the vagina.  Cervix visualized.  Cleaned with Betadine x 2.  Grasped anteriorly with a single tooth tenaculum.  Uterus sounded to 7 cm.   Mirena IUD placed per manufacturer's recommendations.  Strings trimmed to 3 cm. Tenaculum was removed, good hemostasis noted.  Patient tolerated procedure well.   Patient was given post-procedure instructions.  She was advised to have backup contraception for one week.  Patient was also asked to check IUD strings periodically and follow up in 4 weeks for IUD check.  Annamarie Major, MD, [facility], [facility] 11/12/2019  4:46 PM

## 2019-11-12 NOTE — Patient Instructions (Signed)
Intrauterine Device Insertion, Care After  This sheet gives you information about how to care for yourself after your procedure. Your health care provider may also give you more specific instructions. If you have problems or questions, contact your health care provider. What can I expect after the procedure? After the procedure, it is common to have:  Cramps and pain in the abdomen.  Light bleeding (spotting) or heavier bleeding that is like your menstrual period. This may last for up to a few days.  Lower back pain.  Dizziness.  Headaches.  Nausea. Follow these instructions at home:  Before resuming sexual activity, check to make sure that you can feel the IUD string(s). You should be able to feel the end of the string(s) below the opening of your cervix. If your IUD string is in place, you may resume sexual activity. ? If you had a hormonal IUD inserted more than 7 days after your most recent period started, you will need to use a backup method of birth control for 7 days after IUD insertion. Ask your health care provider whether this applies to you.  Continue to check that the IUD is still in place by feeling for the string(s) after every menstrual period, or once a month.  Take over-the-counter and prescription medicines only as told by your health care provider.  Do not drive or use heavy machinery while taking prescription pain medicine.  Keep all follow-up visits as told by your health care provider. This is important. Contact a health care provider if:  You have bleeding that is heavier or lasts longer than a normal menstrual cycle.  You have a fever.  You have cramps or abdominal pain that get worse or do not get better with medicine.  You develop abdominal pain that is new or is not in the same area of earlier cramping and pain.  You feel lightheaded or weak.  You have abnormal or bad-smelling discharge from your vagina.  You have pain during sexual  activity.  You have any of the following problems with your IUD string(s): ? The string bothers or hurts you or your sexual partner. ? You cannot feel the string. ? The string has gotten longer.  You can feel the IUD in your vagina.  You think you may be pregnant, or you miss your menstrual period.  You think you may have an STI (sexually transmitted infection). Get help right away if:  You have flu-like symptoms.  You have a fever and chills.  You can feel that your IUD has slipped out of place. Summary  After the procedure, it is common to have cramps and pain in the abdomen. It is also common to have light bleeding (spotting) or heavier bleeding that is like your menstrual period.  Continue to check that the IUD is still in place by feeling for the string(s) after every menstrual period, or once a month.  Keep all follow-up visits as told by your health care provider. This is important.  Contact your health care provider if you have problems with your IUD string(s), such as the string getting longer or bothering you or your sexual partner. This information is not intended to replace advice given to you by your health care provider. Make sure you discuss any questions you have with your health care provider. Document Revised: 03/03/2017 Document Reviewed: 02/10/2016 Elsevier Patient Education  2020 [employer]  

## 2019-12-05 ENCOUNTER — Other Ambulatory Visit: Payer: Self-pay | Admitting: Obstetrics & Gynecology

## 2019-12-12 ENCOUNTER — Encounter: Payer: Self-pay | Admitting: Obstetrics & Gynecology

## 2019-12-12 ENCOUNTER — Ambulatory Visit (INDEPENDENT_AMBULATORY_CARE_PROVIDER_SITE_OTHER): Payer: BC Managed Care – PPO | Admitting: Obstetrics & Gynecology

## 2019-12-12 ENCOUNTER — Other Ambulatory Visit: Payer: Self-pay

## 2019-12-12 VITALS — BP 120/80 | Ht 66.0 in | Wt 132.0 lb

## 2019-12-12 DIAGNOSIS — Z30431 Encounter for routine checking of intrauterine contraceptive device: Secondary | ICD-10-CM

## 2019-12-12 NOTE — Progress Notes (Signed)
  History of Present Illness:  Kathleen Scott is a 34 y.o. that had a Mirena IUD placed approximately 1 month ago. Since that time, she states that she has had no bleeding and pain.  Had period, improved.  PMHx: She  has a past medical history of Congenital Pulmonic Stenosis, Heart murmur, and Syncope and collapse. Also,  has a past surgical history that includes open heart surgery and Cesarean section (N/A, 05/06/2018)., Family history is unknown by patient.,  reports that she has never smoked. She has never used smokeless tobacco. She reports previous alcohol use. She reports that she does not use drugs. Current Meds  Medication Sig  . levonorgestrel (MIRENA) 20 MCG/24HR IUD 1 each by Intrauterine route once.  .  Also, has No Known Allergies..  Review of Systems  All other systems reviewed and are negative.   Physical Exam:  BP 120/80   Ht 5\' 6"  (1.676 m)   Wt 132 lb (59.9 kg)   LMP 11/12/2019   BMI 21.31 kg/m  Body mass index is 21.31 kg/m. Constitutional: Well nourished, well developed female in no acute distress.  Abdomen: diffusely non tender to palpation, non distended, and no masses, hernias Neuro: Grossly intact Psych:  Normal mood and affect.    Pelvic exam:  Two IUD strings present seen coming from the cervical os. EGBUS, vaginal vault and cervix: within normal limits  Assessment: IUD strings present in proper location; pt doing well  Plan: She was told to continue to use barrier contraception, in order to prevent any STIs, and to take a home pregnancy test or call 01/12/2020 if she ever thinks she may be pregnant, and that her IUD expires in 6 years.  She was amenable to this plan and we will see her back in 1 year/PRN.  A total of 20 minutes were spent face-to-face with the patient as well as preparation, review, communication, and documentation during this encounter.   [place], MD, Annam[facility] Major [facility]/G[facility], [facility] 12/12/2019  4:22 PM

## 2020-06-25 ENCOUNTER — Ambulatory Visit (INDEPENDENT_AMBULATORY_CARE_PROVIDER_SITE_OTHER): Payer: BC Managed Care – PPO | Admitting: Cardiovascular Disease

## 2020-06-25 ENCOUNTER — Other Ambulatory Visit: Payer: Self-pay

## 2020-06-25 ENCOUNTER — Encounter: Payer: Self-pay | Admitting: Cardiovascular Disease

## 2020-06-25 VITALS — BP 126/90 | HR 74 | Ht 66.0 in | Wt 141.0 lb

## 2020-06-25 DIAGNOSIS — I379 Nonrheumatic pulmonary valve disorder, unspecified: Secondary | ICD-10-CM | POA: Diagnosis not present

## 2020-06-25 NOTE — Patient Instructions (Signed)
Medication Instructions:  Your physician recommends that you continue on your current medications as directed. Please refer to the Current Medication list given to you today.  *If you need a refill on your cardiac medications before your next appointment, please call your pharmacy*   Lab Work: None ordered If you have labs (blood work) drawn today and your tests are completely normal, you will receive your results only by: Marland Kitchen MyChart Message (if you have MyChart) OR . A paper copy in the mail If you have any lab test that is abnormal or we need to change your treatment, we will call you to review the results.   Testing/Procedures: Your physician has requested that you have a cardiac MRI. Cardiac MRI uses a computer to create images of your heart as its beating, producing both still and moving pictures of your heart and major blood vessels. For further information please visit [email or web address]. Please follow the instruction sheet given to you today for more information.     Follow-Up: At [facility], you and your health needs are our priority.  As part of our continuing mission to provide you with exceptional heart care, we have created designated Provider Care Teams.  These Care Teams include your primary Cardiologist (physician) and Advanced Practice Providers (APPs -  Physician Assistants and Nurse Practitioners) who all work together to provide you with the care you need, when you need it.  We recommend signing up for the patient portal called "MyChart".  Sign up information is provided on this After Visit Summary.  MyChart is used to connect with patients for Virtual Visits (Telemedicine).  Patients are able to view lab/test results, encounter notes, upcoming appointments, etc.  Non-urgent messages can be sent to your provider as well.   To learn more about what you can do with MyChart, go to [email or web address].    Your next appointment:   Your physician wants you to follow-up  in: 1 year You will receive a reminder letter in the mail two months in advance. If you don't receive a letter, please call our office to schedule the follow-up appointment.   The format for your next appointment:   In Person  Provider:   You may see Lorine Bears, MD or one of the following Advanced Practice Providers on your designated Care Team:    Nicolasa Ducking, NP  Eula Listen, PA-C  Marisue Ivan, PA-C  Cadence [place], [place]  Gillian Shields, NP    Other Instructions You are scheduled for Cardiac MRI on TBD. Please arrive at the [facility] main entrance of [facility] at TBD (30-45 minutes prior to test start time). ?  [facility]  [address]  [place], [place] [address]  [phone]  Proceed to the [facility] Radiology Department (First Floor).  ?  Magnetic resonance imaging (MRI) is a painless test that produces images of the inside of the body without using X-rays. During an MRI, strong magnets and radio waves work together in a Data processing manager to form detailed images. MRI images may provide more details about a medical condition than X-rays, CT scans, and ultrasounds can provide.  You may be given earphones to listen for instructions.  You may eat a light breakfast and take medications as ordered If a contrast material will be used, an IV will be inserted into one of your veins. Contrast material will be injected into your IV.  You will be asked to remove all metal, including: Watch, jewelry, and other metal  objects including hearing aids, hair pieces and dentures. (Braces and fillings normally are not a problem.)  If contrast material was used:  It will leave your body through your urine within a day. You may be told to drink plenty of fluids to help flush the contrast material out of your system.  TEST WILL TAKE APPROXIMATELY 1 HOUR  PLEASE NOTIFY SCHEDULING AT LEAST 24 HOURS IN ADVANCE IF YOU ARE UNABLE TO KEEP YOUR APPOINTMENT.

## 2020-06-25 NOTE — Progress Notes (Signed)
Cardiology Office Note   Date:  06/25/2020   ID:  Kathleen Scott, DOB [birth year], MRN [id number]  PCP:  Emi Belfast, FNP (Inactive)  Cardiologist:   Lorine Bears, MD   Chief Complaint  Patient presents with  . Annual Exam    Pt states she is doing well.      History of Present Illness: Kathleen Scott is a 35 y.o. female who presents for a follow up visit.  She has history of congenital pulmonary valve stenosis status post surgical repair when she was [age] day old at [facility].  She has known severe pulmonary valve regurgitation with mild RV enlargement.  She continues to work as a Runner, broadcasting/film/video. Most recent echocardiogram in April 2021 showed normal LV systolic function, moderately severely enlarged right ventricle, mild pulmonary hypertension and severe pulmonary valve regurgitation.  She continues to be very active and exercises regularly with no limitations.  Past Medical History:  Diagnosis Date  . Congenital Pulmonic Stenosis    a. 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 06/2015 Echo: EF 50-55%, septal wall HK 2/2 post-op state. Mild MR. Mildly dil RV size. Nl RV fxn. Triv PR. Nl PASP.  Marland Kitchen Heart murmur   . Syncope and collapse     Past Surgical History:  Procedure Laterality Date  . CESAREAN SECTION N/A 05/06/2018   Procedure: CESAREAN SECTION;  Surgeon: Nadara Mustard, MD;  Location: [facility] ORS;  Service: Obstetrics;  Laterality: N/A;  . open heart surgery     [facility]     Current Outpatient Medications  Medication Sig Dispense Refill  . levonorgestrel (MIRENA) 20 MCG/24HR IUD 1 each by Intrauterine route once.     No current facility-administered medications for this visit.    Allergies:   Patient has no known allergies.    Social History:  The patient  reports that she has never smoked. She has never used smokeless tobacco. She reports previous alcohol use. She reports that she does  not use drugs.   Family History:  The patient's Family history is unknown by patient.    ROS:  Please see the history of present illness.   Otherwise, review of systems are positive for none.   All other systems are reviewed and negative.    PHYSICAL EXAM: VS:  BP 126/90   Pulse 74   Ht 5\' 6"  (1.676 m)   Wt 141 lb (64 kg)   BMI 22.76 kg/m  , BMI Body mass index is 22.76 kg/m. GEN: Well nourished, well developed, in no acute distress  HEENT: normal  Neck: no JVD, carotid bruits, or masses Cardiac: RRR; there is a 2/6 diastolic decrescendo murmur in the pulmonic area .  S3 is not heard today. Respiratory:  clear to auscultation bilaterally, normal work of breathing GI: soft, nontender, nondistended, + BS MS: no deformity or atrophy  Skin: warm and dry, no rash Neuro:  Strength and sensation are intact Psych: euthymic mood, full affect   EKG:  EKG is ordered today. The ekg ordered today demonstrates sinus rhythm with possible left atrial enlargement.  Incomplete right bundle branch block.   Recent Labs: 10/11/2019: TSH 3.060    Lipid Panel    Component Value Date/Time   CHOL 161 10/11/2019 0836   TRIG 102 10/11/2019 0836   HDL 54 10/11/2019 0836   CHOLHDL 3.0 10/11/2019 0836   LDLCALC 88 10/11/2019 0836      Wt  Readings from Last 3 Encounters:  06/25/20 141 lb (64 kg)  12/12/19 132 lb (59.9 kg)  11/12/19 134 lb 3.2 oz (60.9 kg)        ASSESSMENT AND PLAN:  1.  Pulmonary valve disease: The patient has history of surgical repair of pulmonary valve stenosis. She is known to have moderate to severe pulmonary valve regurgitation.  She continues to be asymptomatic.  I am going to obtain cardiac MRI to better evaluate RV size and function.  According to up-to-date, these are the indications for pulmonary valve replacement:  .We recommend surgical pulmonary valve replacement for symptomatic, severe PR. .We suggest pulmonary valve replacement for asymptomatic severe  PR when any two of the following criteria are present: (1) mild or moderate RV or LV systolic dysfunction, (2) severe RV dilation (RV end-diastolic volume index [RVEDVI] ?160 mL/m2, or RV end-systolic volume index [RVESVI] ?80 mL/m2, or RVEDV greater than or equal to two times the LV end-diastolic volume), (3) RV systolic pressure due to RV outflow tract obstruction greater than or equal to two-thirds the systemic pressure, and (4) progressive reduction in objective exercise tolerance  .We suggest pulmonary valve replacement for patients with severe PR and progressive tricuspid valve regurgitation, although evidence is more limited to support this approach.    Disposition:   FU with me in 1 year.   Signed,  Lorine Bears, MD  06/25/2020 4:01 PM    [facility]

## 2020-06-26 ENCOUNTER — Telehealth: Payer: Self-pay | Admitting: Cardiovascular Disease

## 2020-06-26 NOTE — Telephone Encounter (Signed)
Left message for patient to call and discuss preferred weekdays and times for scheduling the Cardiac MRI ordered by Dr. Kirke Corin

## 2020-06-29 NOTE — Telephone Encounter (Signed)
Left message for patient to call and discuss scheduling the Cardiac MRI ordered by Dr. Kirke Corin

## 2020-07-01 NOTE — Telephone Encounter (Signed)
Spoke with patient concerning preferred scheduling weekdays and times for the Cardiac MRI ordered by Dr. Kirke Corin.  Informed patient as soon as we hear from the insurance prior authorization, I will be in touch with the appointment information.  She voiced her understanding.Marland Kitchen

## 2020-07-02 NOTE — Addendum Note (Signed)
Addended by: Theola Sequin on: 07/02/2020 08:00 AM   Modules accepted: Orders

## 2020-07-06 ENCOUNTER — Encounter: Payer: Self-pay | Admitting: Cardiovascular Disease

## 2020-07-06 NOTE — Telephone Encounter (Signed)
Left message for patient regarding the Tuesday 08/11/20 12:00pm Cardiac MRI appointment at [facility]--arrival time is 11:30 am--1st floor admissions office for check in---will mail information to patient and is available in My Chart.  Requested patient call with questions or concerns.;

## 2020-08-10 ENCOUNTER — Telehealth (HOSPITAL_COMMUNITY): Payer: Self-pay | Admitting: *Deleted

## 2020-08-10 NOTE — Telephone Encounter (Signed)
Attempted to call patient regarding upcoming cardiac MRI appointment. Left message on voicemail with name and callback number  Gari Hartsell RN Navigator Cardiac Imaging [facility] [phone] Office [phone] Cell  

## 2020-08-11 ENCOUNTER — Other Ambulatory Visit: Payer: Self-pay

## 2020-08-11 ENCOUNTER — Ambulatory Visit (HOSPITAL_COMMUNITY)
Admission: RE | Admit: 2020-08-11 | Discharge: 2020-08-11 | Disposition: A | Discharge status: Home / Self Care | Payer: BC Managed Care – PPO | Source: Ambulatory Visit | Attending: Cardiovascular Disease | Admitting: Cardiovascular Disease

## 2020-08-11 DIAGNOSIS — I379 Nonrheumatic pulmonary valve disorder, unspecified: Secondary | ICD-10-CM | POA: Insufficient documentation

## 2020-08-11 MED ORDER — GADOBUTROL 1 MMOL/ML IV SOLN
6.0000 mL | Freq: Once | INTRAVENOUS | Status: AC | PRN
  Administered 2020-08-11: 6 mL via INTRAVENOUS

## 2020-08-17 DIAGNOSIS — I379 Nonrheumatic pulmonary valve disorder, unspecified: Secondary | ICD-10-CM

## 2020-08-18 NOTE — Telephone Encounter (Signed)
Patient made aware of Cardiac MRI results and Dr. Jari Sportsman recommendation. Order placed for 1 yr repeat Cardiac MRI. Patient verbalized understanding and voiced appreciation for the call.

## 2020-09-01 ENCOUNTER — Telehealth: Payer: Self-pay | Admitting: Cardiovascular Disease

## 2020-09-01 NOTE — Telephone Encounter (Signed)
Left message for patient to call and discuss preferred scheduling weekdays and times for the Cardiac MRI ordered by Dr. Kirke Corin

## 2020-09-08 NOTE — Telephone Encounter (Signed)
Left message for patient to call and discuss scheduling preferences for the Cardiac MRI ordered by Dr. Kirke Corin

## 2020-10-12 ENCOUNTER — Ambulatory Visit: Payer: BC Managed Care – PPO | Admitting: Obstetrics & Gynecology

## 2020-10-16 ENCOUNTER — Telehealth: Payer: Self-pay | Admitting: Cardiovascular Disease

## 2020-10-16 NOTE — Telephone Encounter (Signed)
    Patient Name: Kathleen Scott  DOB: 07-15-1985 MRN: [id number]  Primary Cardiologist: Lorine Bears, MD  Chart reviewed as part of pre-operative protocol coverage. She has a history of congenital heart disease and underwent pulmonic valve surgery at 1 day old for pulmonic valve stenosis. Her last echo 07/2019 showed EF 55%, low normal RV systolic function, moderately to severely enlarged RV, RVSP 34.8 mmHg, and severe PR. At her follow-up visit with Dr. Kirke Corin 06/2020 she was recommended to undergo a cardiac MR to better evaluate her RV function to evaluate need for surgical evaluation. RV was moderately dilated but felt to not require surgery at that point and was recommended for repeat cardiac MR in 1 year.   Left a voicemail for patient to call back for ongoing preop evaluation.   Dr. Kirke Corin, from your perspective, as long as she is not complaining of any new symptoms, are there any specific recommendations for her surgery team? Please route your response back to P CV DIV PREOP. Thanks!  Beatriz Stallion, PA-C 10/16/2020, 3:37 PM

## 2020-10-16 NOTE — Telephone Encounter (Signed)
   [facility] Pre-operative Risk Assessment    Patient Name: Kathleen Scott  DOB: 04-08-1985 MRN: [id number]  HEARTCARE STAFF:  - IMPORTANT!!!!!! Under Visit Info/Reason for Call, type in Other and utilize the format Clearance MM/DD/YY or Clearance TBD. Do not use dashes or single digits. - Please review there is not already an duplicate clearance open for this procedure. - If request is for dental extraction, please clarify the # of teeth to be extracted. - If the patient is currently at the dentist's office, call Pre-Op Callback Staff (MA/nurse) to input urgent request.  - If the patient is not currently in the dentist office, please route to the Pre-Op pool.  Request for surgical clearance:  What type of surgery is being performed? Breast augmentation   When is this surgery scheduled? TBD but wants clearance before consult 10/26/20  What type of clearance is required (medical clearance vs. Pharmacy clearance to hold med vs. Both)? both  Are there any medications that need to be held prior to surgery and how long? Not listed, please advise if needed  Practice name and name of physician performing surgery? Dr Denice Bors. [place]  What is the office phone number? [phone]   7.   What is the office fax number? [phone]  8.   Anesthesia type (None, local, MAC, general) ? MAC and local    Ace Gins 10/16/2020, 2:17 PM  _________________________________________________________________   (provider comments below)

## 2020-10-16 NOTE — Telephone Encounter (Signed)
   Name: Kathleen Scott  DOB: 22-Apr-1985  MRN: [id number]   Primary Cardiologist: Lorine Bears, MD  Chart reviewed as part of pre-operative protocol coverage. Patient was contacted 10/16/2020 in reference to pre-operative risk assessment for pending surgery as outlined below.  Payten Hobin Milsap was last seen on 06/25/20 by Dr. Kirke Corin.  Since that day, REBECKA OELKERS has done well from a cardiac standpoint. She continues to exercise regularly and can easily complete 4 METs without anginal complaints.   Therefore, based on ACC/AHA guidelines, the patient would be at acceptable risk for the planned procedure without further cardiovascular testing.   As noted below, will await input from Dr. Kirke Corin regarding her preop risk before finalizing her clearance note.   Beatriz Stallion, PA-C 10/16/2020, 4:48 PM

## 2020-10-19 ENCOUNTER — Ambulatory Visit: Payer: BC Managed Care – PPO | Admitting: Obstetrics & Gynecology

## 2020-10-22 NOTE — Telephone Encounter (Signed)
   Name: Kathleen Scott  DOB: 08/05/1985  MRN: [id number]   Primary Cardiologist: Lorine Bears, MD  Chart reviewed as part of pre-operative protocol coverage. She has history of pulmonary valve disease s/p surgical repair of pulmonary valve stenosis.  She is known to have moderate to severe PVR.    Kathleen Scott was last seen on 06/25/2020 by Dr. Kirke Corin.  She continued to be asymptomatic.  Recommendation was for cardiac MRI, obtained 08/11/2020, and which showed severe pulmonary valve regurgitation, which was noted to have been known.  RV was moderately dilated, but it was not enough to recommend surgery.  Repeat cardiac MRI was recommended in 1 year.  Since that day, she has done well from a cardiac standpoint.  She continues to exercise regularly.  She is able to complete 4 METS without any anginal symptoms. Her primary cardiologist, Dr. Kirke Corin, was contacted for preoperative clearance and stated severe pulmonary insufficiency is well-tolerated during surgeries, so the patient should be considered at low risk from a cardiovascular standpoint.  Therefore, based on ACC/AHA guidelines, the patient would be at acceptable risk for the planned procedure without further cardiovascular testing.   I will route this recommendation to the requesting party via Epic fax function and remove from pre-op pool. Please call with questions.  Lennon Alstrom, PA-C 10/22/2020, 9:22 AM

## 2020-10-22 NOTE — Telephone Encounter (Signed)
Severe PI is well-tolerated during surgeries.  She should be considered at low risk from a cardiac standpoint.

## 2020-11-17 ENCOUNTER — Ambulatory Visit: Payer: Self-pay | Admitting: Obstetrics & Gynecology

## 2020-12-23 ENCOUNTER — Other Ambulatory Visit: Payer: Self-pay

## 2020-12-23 ENCOUNTER — Ambulatory Visit (INDEPENDENT_AMBULATORY_CARE_PROVIDER_SITE_OTHER): Payer: BC Managed Care – PPO | Admitting: Obstetrics & Gynecology

## 2020-12-23 ENCOUNTER — Encounter: Payer: Self-pay | Admitting: Obstetrics & Gynecology

## 2020-12-23 VITALS — BP 120/80 | Ht 66.0 in | Wt 140.0 lb

## 2020-12-23 DIAGNOSIS — Z30431 Encounter for routine checking of intrauterine contraceptive device: Secondary | ICD-10-CM

## 2020-12-23 DIAGNOSIS — Z01419 Encounter for gynecological examination (general) (routine) without abnormal findings: Secondary | ICD-10-CM

## 2020-12-23 NOTE — Patient Instructions (Signed)
Recommendations to boost your immunity to prevent illness such as viral flu and colds, including covid19, are as follows:       - - -  Vitamin K2 and Vitamin D3  - - - Take Vitamin K2 at 200-300 mcg daily (usually 2-3 pills daily of the over the counter formulation). Take Vitamin D3 at 3000-4000 U daily (usually 3-4 pills daily of the over the counter formulation). Studies show that these two at high normal levels in your system are very effective in keeping your immunity so strong and protective that you will be unlikely to contract viral illness such as those listed above.  Dr Tiburcio Pea  Health Maintenance, Female Adopting a healthy lifestyle and getting preventive care are important in promoting health and wellness. Ask your health care provider about: The right schedule for you to have regular tests and exams. Things you can do on your own to prevent diseases and keep yourself healthy. What should I know about diet, weight, and exercise? Eat a healthy diet  Eat a diet that includes plenty of vegetables, fruits, low-fat dairy products, and lean protein. Do not eat a lot of foods that are high in solid fats, added sugars, or sodium. Maintain a healthy weight Body mass index (BMI) is used to identify weight problems. It estimates body fat based on height and weight. Your health care provider can help determine your BMI and help you achieve or maintain a healthy weight. Get regular exercise Get regular exercise. This is one of the most important things you can do for your health. Most adults should: Exercise for at least 150 minutes each week. The exercise should increase your heart rate and make you sweat (moderate-intensity exercise). Do strengthening exercises at least twice a week. This is in addition to the moderate-intensity exercise. Spend less time sitting. Even light physical activity can be beneficial. Watch cholesterol and blood lipids Have your blood tested for lipids and  cholesterol at 35 years of age, then have this test every 5 years. Have your cholesterol levels checked more often if: Your lipid or cholesterol levels are high. You are older than 35 years of age. You are at high risk for heart disease. What should I know about cancer screening? Depending on your health history and family history, you may need to have cancer screening at various ages. This may include screening for: Breast cancer. Cervical cancer. Colorectal cancer. Skin cancer. Lung cancer. What should I know about heart disease, diabetes, and high blood pressure? Blood pressure and heart disease High blood pressure causes heart disease and increases the risk of stroke. This is more likely to develop in people who have high blood pressure readings, are of African descent, or are overweight. Have your blood pressure checked: Every 3-5 years if you are 35-35 years of age. Every year if you are 35 years old or older. Diabetes Have regular diabetes screenings. This checks your fasting blood sugar level. Have the screening done: Once every three years after age 35 if you are at a normal weight and have a low risk for diabetes. More often and at a younger age if you are overweight or have a high risk for diabetes. What should I know about preventing infection? Hepatitis B If you have a higher risk for hepatitis B, you should be screened for this virus. Talk with your health care provider to find out if you are at risk for hepatitis B infection. Hepatitis C Testing is recommended for: Everyone born from [birth year]  through 1965. Anyone with known risk factors for hepatitis C. Sexually transmitted infections (STIs) Get screened for STIs, including gonorrhea and chlamydia, if: You are sexually active and are younger than 35 years of age. You are older than 35 years of age and your health care provider tells you that you are at risk for this type of infection. Your sexual activity has changed since  you were last screened, and you are at increased risk for chlamydia or gonorrhea. Ask your health care provider if you are at risk. Ask your health care provider about whether you are at high risk for HIV. Your health care provider may recommend a prescription medicine to help prevent HIV infection. If you choose to take medicine to prevent HIV, you should first get tested for HIV. You should then be tested every 3 months for as long as you are taking the medicine. Pregnancy If you are about to stop having your period (premenopausal) and you may become pregnant, seek counseling before you get pregnant. Take 400 to 800 micrograms (mcg) of folic acid every day if you become pregnant. Ask for birth control (contraception) if you want to prevent pregnancy. Osteoporosis and menopause Osteoporosis is a disease in which the bones lose minerals and strength with aging. This can result in bone fractures. If you are 14 years old or older, or if you are at risk for osteoporosis and fractures, ask your health care provider if you should: Be screened for bone loss. Take a calcium or vitamin D supplement to lower your risk of fractures. Be given hormone replacement therapy (HRT) to treat symptoms of menopause. Follow these instructions at home: Lifestyle Do not use any products that contain nicotine or tobacco, such as cigarettes, e-cigarettes, and chewing tobacco. If you need help quitting, ask your health care provider. Do not use street drugs. Do not share needles. Ask your health care provider for help if you need support or information about quitting drugs. Alcohol use Do not drink alcohol if: Your health care provider tells you not to drink. You are pregnant, may be pregnant, or are planning to become pregnant. If you drink alcohol: Limit how much you use to 0-1 drink a day. Limit intake if you are breastfeeding. Be aware of how much alcohol is in your drink. In the [place] one drink equals one 12 oz  bottle of beer (355 mL), one 5 oz glass of wine (148 mL), or one 1 oz glass of hard liquor (44 mL). General instructions Schedule regular health, dental, and eye exams. Stay current with your vaccines. Tell your health care provider if: You often feel depressed. You have ever been abused or do not feel safe at home. Summary Adopting a healthy lifestyle and getting preventive care are important in promoting health and wellness. Follow your health care provider's instructions about healthy diet, exercising, and getting tested or screened for diseases. Follow your health care provider's instructions on monitoring your cholesterol and blood pressure. This information is not intended to replace advice given to you by your health care provider. Make sure you discuss any questions you have with your health care provider. Document Revised: 05/29/2020 Document Reviewed: 03/14/2018 Elsevier Patient Education  2022 [employer].

## 2020-12-23 NOTE — Progress Notes (Signed)
HPI:      [name] is a 35 y.o. G1P1001 who LMP was No LMP recorded., she presents today for her annual examination. The patient has no complaints today. The patient is sexually active. Her last pap: approximate date 2021 and was normal. The patient does perform self breast exams.  There is no notable family history of breast or ovarian cancer in her family.  The patient has regular exercise: yes.  The patient denies current symptoms of depression.    GYN History: Contraception: IUD  PMHx: Past Medical History:  Diagnosis Date   Congenital Pulmonic Stenosis    a. 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 06/2015 Echo: EF 50-55%, septal wall HK 2/2 post-op state. Mild MR. Mildly dil RV size. Nl RV fxn. Triv PR. Nl PASP.   Heart murmur    Syncope and collapse    Past Surgical History:  Procedure Laterality Date   CESAREAN SECTION N/A 05/06/2018   Procedure: CESAREAN SECTION;  Surgeon: Nadara Mustard, MD;  Location: [facility] ORS;  Service: Obstetrics;  Laterality: N/A;   open heart surgery     [facility]   Family History  Family history unknown: Yes   Social History   Tobacco Use   Smoking status: Never   Smokeless tobacco: Never  Vaping Use   Vaping Use: Never used  Substance Use Topics   Alcohol use: Not Currently   Drug use: No    Current Outpatient Medications:    levonorgestrel (MIRENA) 20 MCG/24HR IUD, 1 each by Intrauterine route once., Disp: , Rfl:  Allergies: Patient has no known allergies.  Review of Systems  Constitutional:  Negative for chills, fever and malaise/fatigue.  HENT:  Negative for congestion, sinus pain and sore throat.   Eyes:  Negative for blurred vision and pain.  Respiratory:  Negative for cough and wheezing.   Cardiovascular:  Negative for chest pain and leg swelling.  Gastrointestinal:  Negative for abdominal pain, constipation, diarrhea, heartburn, nausea and vomiting.  Genitourinary:   Negative for dysuria, frequency, hematuria and urgency.  Musculoskeletal:  Negative for back pain, joint pain, myalgias and neck pain.  Skin:  Negative for itching and rash.  Neurological:  Negative for dizziness, tremors and weakness.  Endo/Heme/Allergies:  Does not bruise/bleed easily.  Psychiatric/Behavioral:  Negative for depression. The patient is not nervous/anxious and does not have insomnia.    Objective: BP 120/80   Ht 5\' 6"  (1.676 m)   Wt 140 lb (63.5 kg)   BMI 22.60 kg/m   Filed Weights   12/23/20 1529  Weight: 140 lb (63.5 kg)   Body mass index is 22.6 kg/m. Physical Exam Constitutional:      General: She is not in acute distress.    Appearance: She is well-developed.  Genitourinary:     Bladder, rectum and urethral meatus normal.     No lesions in the vagina.     Right Labia: No rash, tenderness or lesions.    Left Labia: No tenderness, lesions or rash.    No vaginal bleeding.      Right Adnexa: not tender and no mass present.    Left Adnexa: not tender and no mass present.    No cervical motion tenderness, friability, lesion or polyp.     No IUD strings visualized.     Uterus is not enlarged.     No uterine mass detected.    Pelvic exam was performed with patient  in the lithotomy position.  Breasts:    Right: No mass, skin change or tenderness.     Left: No mass, skin change or tenderness.  HENT:     Head: Normocephalic and atraumatic. No laceration.     Right Ear: Hearing normal.     Left Ear: Hearing normal.     Mouth/Throat:     Pharynx: Uvula midline.  Eyes:     Pupils: Pupils are equal, round, and reactive to light.  Neck:     Thyroid: No thyromegaly.  Cardiovascular:     Rate and Rhythm: Normal rate and regular rhythm.     Heart sounds: No murmur heard.   No friction rub. No gallop.  Pulmonary:     Effort: Pulmonary effort is normal. No respiratory distress.     Breath sounds: Normal breath sounds. No wheezing.  Abdominal:     General:  Bowel sounds are normal. There is no distension.     Palpations: Abdomen is soft.     Tenderness: There is no abdominal tenderness. There is no rebound.  Musculoskeletal:        General: Normal range of motion.     Cervical back: Normal range of motion and neck supple.  Neurological:     Mental Status: She is alert and oriented to person, place, and time.     Cranial Nerves: No cranial nerve deficit.  Skin:    General: Skin is warm and dry.  Psychiatric:        Judgment: Judgment normal.  Vitals reviewed.    Assessment:  ANNUAL EXAM 1. Women's annual routine gynecological examination   2. IUD check up     Screening Plan:            1.  Cervical Screening-  Pap smear schedule reviewed with patient, Pap smear to be scheduled, 2024  2. Breast screening- Exam annually and mammogram>40 planned   3. Colonoscopy every 10 years, Hemoccult testing - after age 57  4. Labs  normal last year  5. Counseling for contraception: IUD  Upstream - 12/23/20 1530       Pregnancy Intention Screening   Does the patient want to become pregnant in the next year? No    Does the patient's partner want to become pregnant in the next year? No    Would the patient like to discuss contraceptive options today? No      Contraception Wrap Up   Current Method IUD or IUS    End Method IUD or IUS    Contraception Counseling Provided No            The pregnancy intention screening data noted above was reviewed. Potential methods of contraception were discussed. The patient elected to proceed with IUD or IUS.      F/U  Return in about 1 year (around 12/23/2021) for Annual.  Annamarie Major, MD, [facility], [facility] 12/23/2020  4:00 PM

## 2021-03-02 NOTE — Telephone Encounter (Signed)
Mirena rcvd/charged 11/12/2019

## 2022-06-10 ENCOUNTER — Ambulatory Visit: Payer: BC Managed Care – PPO | Attending: Cardiovascular Disease | Admitting: Cardiovascular Disease

## 2022-06-10 ENCOUNTER — Encounter: Payer: Self-pay | Admitting: Cardiovascular Disease

## 2022-06-10 VITALS — BP 110/62 | HR 69 | Ht 66.0 in | Wt 139.5 lb

## 2022-06-10 DIAGNOSIS — I379 Nonrheumatic pulmonary valve disorder, unspecified: Secondary | ICD-10-CM

## 2022-06-10 NOTE — Patient Instructions (Signed)
Medication Instructions:  No changes *If you need a refill on your cardiac medications before your next appointment, please call your pharmacy*   Lab Work: Your provider would like for you to return before the MRI to have the following labs drawn: CBC.   Please go to the [facility] entrance and check in at the front desk.  You do not need an appointment.  They are open from 7am-6 pm.  You will not need to be fasting.  If you have labs (blood work) drawn today and your tests are completely normal, you will receive your results only by: [place] (if you have MyChart) OR A paper copy in the mail If you have any lab test that is abnormal or we need to change your treatment, we will call you to review the results.   Follow-Up: At [facility], you and your health needs are our priority.  As part of our continuing mission to provide you with exceptional heart care, we have created designated Provider Care Teams.  These Care Teams include your primary Cardiologist (physician) and Advanced Practice Providers (APPs -  Physician Assistants and Nurse Practitioners) who all work together to provide you with the care you need, when you need it.  We recommend signing up for the patient portal called "MyChart".  Sign up information is provided on this After Visit Summary.  MyChart is used to connect with patients for Virtual Visits (Telemedicine).  Patients are able to view lab/test results, encounter notes, upcoming appointments, etc.  Non-urgent messages can be sent to your provider as well.   To learn more about what you can do with MyChart, go to [email or web address].    Your next appointment:   12 month(s)  Provider:   You may see Kathlyn Sacramento, MD or one of the following Advanced Practice Providers on your designated Care Team:   Murray Hodgkins, NP Christell Faith, PA-C Cadence Kathlen Mody, PA-C Gerrie Nordmann, NP    Other Instructions   You are scheduled for Cardiac MRI  on ______________. Please arrive for your appointment at ______________ ( arrive 30-45 minutes prior to test start time). ?  [facility] [address] [place], [place] [address] [phone] Please take advantage of the free valet parking available at the MAIN entrance (A entrance).  Proceed to the [facility] Radiology Department (First Floor) for check-in.   [facility] [place], [place] [address] [phone] Please take advantage of the free valet parking available at the MAIN entrance. Proceed to [facility] registration for check-in (first floor).  Magnetic resonance imaging (MRI) is a painless test that produces images of the inside of the body without using Xrays.  During an MRI, strong magnets and radio waves work together in a Research officer, political party to form detailed images.   MRI images may provide more details about a medical condition than X-rays, CT scans, and ultrasounds can provide.  You may be given earphones to listen for instructions.  You may eat a light breakfast and take medications as ordered with the exception of furosemide, hydrochlorothiazide, or spironolactone(fluid pill, other). Please avoid stimulants for 12 hr prior to test. (Ie. Caffeine, nicotine, chocolate, or antihistamine medications)  If a contrast material will be used, an IV will be inserted into one of your veins. Contrast material will be injected into your IV. It will leave your body through your urine within a day. You may be told to drink plenty of  fluids to help flush the contrast material out of your system.  You will be asked to remove all metal, including: Watch, jewelry, and other metal objects including hearing aids, hair pieces and dentures. Also wearable glucose monitoring systems (ie. Freestyle Libre and Omnipods) (Braces and fillings normally are not a problem.)   TEST WILL TAKE APPROXIMATELY 1 HOUR  PLEASE NOTIFY SCHEDULING AT LEAST 24  HOURS IN ADVANCE IF YOU ARE UNABLE TO KEEP YOUR APPOINTMENT. [phone]  Please call Marchia Bond, cardiac imaging nurse navigator with any questions/concerns. Marchia Bond RN Navigator Cardiac Imaging Gordy Clement RN Navigator Cardiac Imaging [facility] [phone] Office

## 2022-06-10 NOTE — Progress Notes (Signed)
Cardiology Office Note   Date:  06/10/2022   ID:  Kathleen Scott, DOB Aug 20, 1985, MRN [id number]  PCP:  Elby Beck, FNP  Cardiologist:   Kathlyn Sacramento, MD   Chief Complaint  Patient presents with   1 year follow up     "Doing well." Medications reviewed by the patient verbally.       History of Present Illness: Kathleen Scott is a 37 y.o. female who presents for a follow up visit.  She has history of congenital pulmonary valve stenosis status post surgical repair when she was [facility] day [facility] at [facility].  She has known severe pulmonary valve regurgitation with mild-moderate RV enlargement.  She continues to work as a Pharmacist, hospital. Most recent echocardiogram in April 2021 showed normal LV systolic function, moderately severely enlarged right ventricle, mild pulmonary hypertension and severe pulmonary valve regurgitation.  She continues to be very active and exercises regularly with no limitations.  Cardiac MRI was done in May 2022 which showed severe pulmonary valve regurgitation, moderately dilated right ventricle with RV EDVI of 147 mm/m2, RVEF of 49% and LVEF 54%.  Past Medical History:  Diagnosis Date   Congenital Pulmonic Stenosis    a. 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 06/2015 Echo: EF 50-55%, septal wall HK 2/2 post-op state. Mild MR. Mildly dil RV size. Nl RV fxn. Triv PR. Nl PASP.   Heart murmur    Syncope and collapse     Past Surgical History:  Procedure Laterality Date   CESAREAN SECTION N/A 05/06/2018   Procedure: CESAREAN SECTION;  Surgeon: Gae Dry, MD;  Location: [facility] ORS;  Service: Obstetrics;  Laterality: N/A;   open heart surgery     [facility]     Current Outpatient Medications  Medication Sig Dispense Refill   levonorgestrel (MIRENA) 20 MCG/24HR IUD 1 each by Intrauterine route once.     No current facility-administered medications for this visit.    Allergies:    Patient has no known allergies.    Social History:  The patient  reports that she has never smoked. She has never used smokeless tobacco. She reports that she does not currently use alcohol. She reports that she does not use drugs.   Family History:  The patient's Family history is unknown by patient.    ROS:  Please see the history of present illness.   Otherwise, review of systems are positive for none.   All other systems are reviewed and negative.    PHYSICAL EXAM: VS:  BP 110/62 (BP Location: Left Arm, Patient Position: Sitting, Cuff Size: Normal)   Pulse 69   Ht '5\' 6"'$  (1.676 m)   Wt 139 lb 8 oz (63.3 kg)   SpO2 98%   BMI 22.52 kg/m  , BMI Body mass index is 22.52 kg/m. GEN: Well nourished, well developed, in no acute distress  HEENT: normal  Neck: no JVD, carotid bruits, or masses Cardiac: RRR; there is a 2/6 diastolic decrescendo murmur in the pulmonic area .  S3 is not heard today. Respiratory:  clear to auscultation bilaterally, normal work of breathing GI: soft, nontender, nondistended, + BS MS: no deformity or atrophy  Skin: warm and dry, no rash Neuro:  Strength and sensation are intact Psych: euthymic mood, full affect   EKG:  EKG is ordered today. The ekg ordered today demonstrates sinus rhythm with possible left atrial enlargement.  Incomplete right bundle branch  block.  No significant change from the   Recent Labs: No results found for requested labs within last 365 days.    Lipid Panel    Component Value Date/Time   CHOL 161 10/11/2019 0836   TRIG 102 10/11/2019 0836   HDL 54 10/11/2019 0836   CHOLHDL 3.0 10/11/2019 0836   LDLCALC 88 10/11/2019 0836      Wt Readings from Last 3 Encounters:  06/10/22 139 lb 8 oz (63.3 kg)  12/23/20 140 lb (63.5 kg)  06/25/20 141 lb (64 kg)        ASSESSMENT AND PLAN:  1.  Pulmonary valve disease: The patient has history of surgical repair of pulmonary valve stenosis. She is known to have moderate to severe  pulmonary valve regurgitation.  She continues to be asymptomatic.  Right ventricle was moderately dilated on last MRI but not significant enough to proceed with surgery. I requested a follow-up cardiac MRI  According to up-to-date, these are the indications for pulmonary valve replacement:  We recommend surgical pulmonary valve replacement for symptomatic, severe PR. We suggest pulmonary valve replacement for asymptomatic severe PR when any two of the following criteria are present: (1) mild or moderate RV or LV systolic dysfunction, (2) severe RV dilation (RV end-diastolic volume index [RVEDVI] ?160 mL/m2, or RV end-systolic volume index [RVESVI] ?80 mL/m2, or RVEDV greater than or equal to two times the LV end-diastolic volume), (3) RV systolic pressure due to RV outflow tract obstruction greater than or equal to two-thirds the systemic pressure, and (4) progressive reduction in objective exercise tolerance  We suggest pulmonary valve replacement for patients with severe PR and progressive tricuspid valve regurgitation, although evidence is more limited to support this approach.    Disposition:   FU with me in 1 year.   Signed,  Kathlyn Sacramento, MD  06/10/2022 8:29 AM    [facility]

## 2022-08-24 ENCOUNTER — Other Ambulatory Visit: Payer: BC Managed Care – PPO

## 2022-09-13 ENCOUNTER — Telehealth (HOSPITAL_COMMUNITY): Payer: Self-pay | Admitting: Emergency Medicine

## 2022-09-13 NOTE — Telephone Encounter (Signed)
Reaching out to patient to offer assistance regarding upcoming cardiac imaging study; pt verbalizes understanding of appt date/time, parking situation and where to check in, pre-test NPO status and medications ordered, and verified current allergies; name and call back number provided for further questions should they arise Elyse Prevo RN Navigator Cardiac Imaging [facility] [phone] office [phone] cell 

## 2022-09-13 NOTE — Telephone Encounter (Signed)
Attempted to call patient regarding upcoming cardiac MR appointment. Left message on voicemail with name and callback number Reianna Batdorf RN Navigator Cardiac Imaging [facility] [phone] Office [phone] Cell  

## 2022-09-14 ENCOUNTER — Ambulatory Visit
Admission: RE | Admit: 2022-09-14 | Discharge: 2022-09-14 | Disposition: A | Discharge status: Home / Self Care | Payer: BC Managed Care – PPO | Source: Ambulatory Visit | Attending: Cardiovascular Disease | Admitting: Cardiovascular Disease

## 2022-09-14 ENCOUNTER — Other Ambulatory Visit: Payer: Self-pay | Admitting: Cardiovascular Disease

## 2022-09-14 DIAGNOSIS — I379 Nonrheumatic pulmonary valve disorder, unspecified: Secondary | ICD-10-CM | POA: Diagnosis not present

## 2022-09-14 MED ORDER — GADOBUTROL 1 MMOL/ML IV SOLN
9.0000 mL | Freq: Once | INTRAVENOUS | Status: AC | PRN
  Administered 2022-09-14: 9 mL via INTRAVENOUS

## 2022-09-20 ENCOUNTER — Encounter: Payer: Self-pay | Admitting: Cardiovascular Disease

## 2022-09-20 ENCOUNTER — Other Ambulatory Visit: Payer: Self-pay | Admitting: *Deleted

## 2022-09-20 DIAGNOSIS — I379 Nonrheumatic pulmonary valve disorder, unspecified: Secondary | ICD-10-CM

## 2022-09-20 DIAGNOSIS — I371 Nonrheumatic pulmonary valve insufficiency: Secondary | ICD-10-CM

## 2023-01-17 IMAGING — MR MR CARD MORPHOLOGY WO/W CM
43 of 48 series · 43 of 48 positions shown · IV contrast (gadavist)
Comparison: none

CLINICAL DATA: Hx of pulmonary regurgitation, evaluate rv size,
function

EXAM:
CARDIAC MRI
TECHNIQUE: The patient was scanned on a 1.5 Tesla GE magnet. A dedicated
cardiac coil was used. Functional imaging was done using [employer]
sequences. [DATE], and 4 chamber views were done to assess for RWMA's.
Modified Plante rule using a short axis stack was used to
calculate an ejection fraction on a dedicated work station using
[employer] software. The patient received 6 cc of Gadavist. After 10
minutes inversion recovery sequences were used to assess for
infiltration and scar tissue.
CONTRAST:  6 cc  of Gadavist

[Series 4: t2_haste_db_tra_bh · axial · 8.0mm · 1.41mm/px · 1 of 16 slices shown]
[im 1/16]
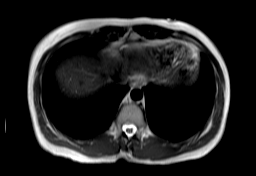

[Series 9: bSSFP · oblique · 8.0mm · 1.61mm/px · 1 of 25 slices shown (1 of 21)]
[im 1/25]
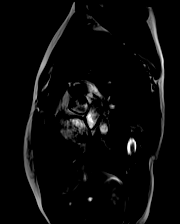

[Series 11: bSSFP · oblique · 8.0mm · 1.61mm/px · 1 of 25 slices shown (2 of 21)]
[im 1/25]
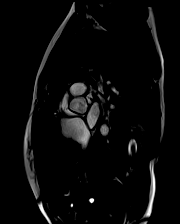

[Series 12: bSSFP · oblique · 8.0mm · 1.61mm/px · 1 of 25 slices shown (3 of 21)]
[im 1/25]
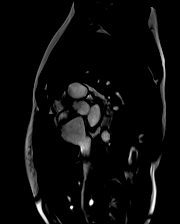

[Series 13: bSSFP · oblique · 8.0mm · 1.61mm/px · 1 of 25 slices shown (4 of 21)]
[im 1/25]
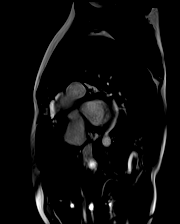

[Series 14: bSSFP · oblique · 8.0mm · 1.61mm/px · 1 of 25 slices shown (5 of 21)]
[im 1/25]
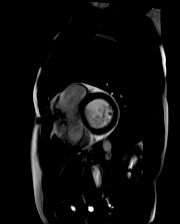

[Series 15: bSSFP · oblique · 8.0mm · 1.61mm/px · 1 of 25 slices shown (6 of 21)]
[im 1/25]
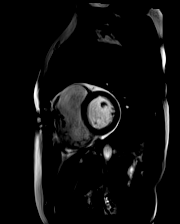

[Series 16: bSSFP · oblique · 8.0mm · 1.61mm/px · 1 of 25 slices shown (7 of 21)]
[im 1/25]
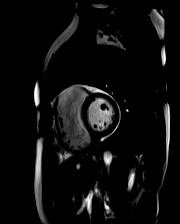

[Series 17: bSSFP · oblique · 8.0mm · 1.61mm/px · 1 of 25 slices shown (8 of 21)]
[im 1/25]
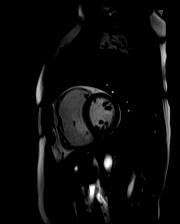

[Series 18: bSSFP · oblique · 8.0mm · 1.61mm/px · 1 of 25 slices shown (9 of 21)]
[im 1/25]
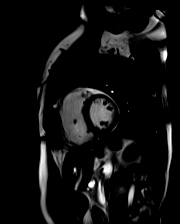

[Series 19: bSSFP · oblique · 8.0mm · 1.61mm/px · 1 of 25 slices shown (10 of 21)]
[im 1/25]
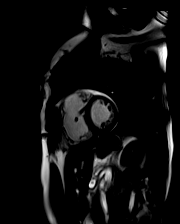

[Series 20: bSSFP · oblique · 8.0mm · 1.61mm/px · 1 of 25 slices shown (11 of 21)]
[im 1/25]
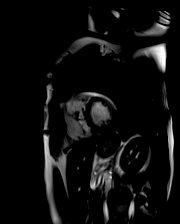

[Series 21: bSSFP · oblique · 8.0mm · 1.61mm/px · 1 of 25 slices shown (12 of 21)]
[im 1/25]
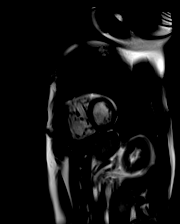

[Series 22: bSSFP · oblique · 8.0mm · 1.61mm/px · 1 of 25 slices shown (13 of 21)]
[im 1/25]
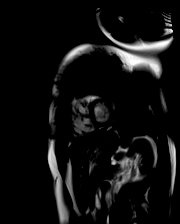

[Series 23: bSSFP · oblique · 8.0mm · 1.61mm/px · 1 of 25 slices shown (14 of 21)]
[im 1/25]
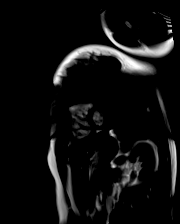

[Series 24: bSSFP · oblique · 8.0mm · 1.61mm/px · 1 of 25 slices shown (15 of 21)]
[im 1/25]
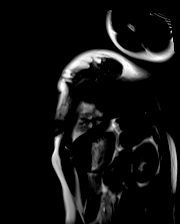

[Series 25: bSSFP · oblique · 8.0mm · 1.61mm/px · 1 of 25 slices shown (16 of 21)]
[im 1/25]
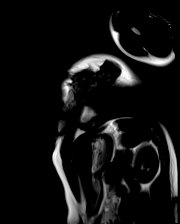

[Series 26: bSSFP · oblique · 8.0mm · 1.61mm/px · 1 of 25 slices shown (17 of 21)]
[im 1/25]
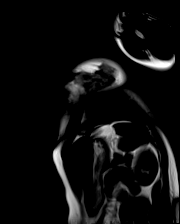

[Series 27: bSSFP · oblique · 6.0mm · 1.41mm/px · 1 of 25 slices shown (18 of 21)]
[im 1/25]
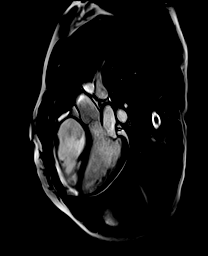

[Series 28: bSSFP · coronal · 6.0mm · 1.41mm/px · 1 of 25 slices shown (19 of 21)]
[im 1/25]
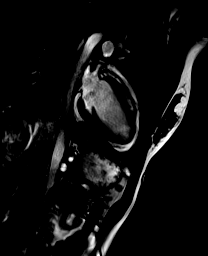

[Series 29: bSSFP · oblique · 6.0mm · 1.41mm/px · 1 of 25 slices shown (20 of 21)]
[im 1/25]
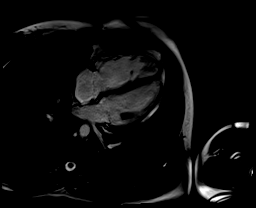

[Series 30: (id)_trufi · oblique · 8.0mm · 2.08mm/px · 1 of 9 slices shown]
[im 1/9]
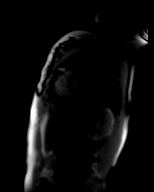

[Series 31: (id)_trufi_moco · oblique · 8.0mm · 2.08mm/px · 1 of 9 slices shown]
[im 1/9]
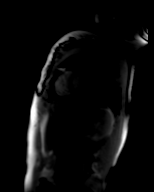

[Series 32: (id)_trufi_moco_t2 · oblique · 8.0mm · 2.08mm/px · 1 of 3 slices shown]
[im 1/3]
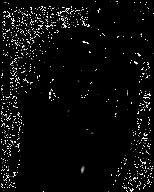

[Series 34: (id)_long_t1 · oblique · 8.0mm · 1.56mm/px · 1 of 24 slices shown]
[im 1/24]
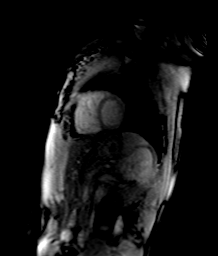

[Series 35: (id)_long_t1_moco · oblique · 8.0mm · 1.56mm/px · 1 of 24 slices shown]
[im 1/24]
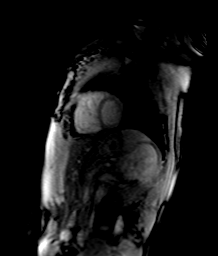

[Series 36: (id)_long_t1_moco_t1 · oblique · 8.0mm · 1.56mm/px · 1 of 6 slices shown]
[im 1/6]
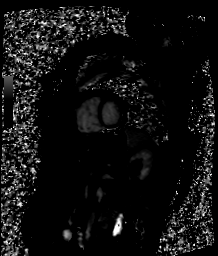

[Series 38: cine rvot · sagittal · 6.0mm · 1.41mm/px · 1 of 25 slices shown]
[im 1/25]
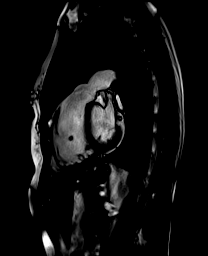

[Series 39: cor rvot · oblique · 6.0mm · 1.41mm/px · 1 of 25 slices shown (1 of 2)]
[im 1/25]
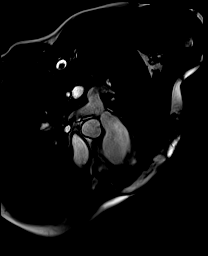

[Series 40: bSSFP · coronal · 6.0mm · 1.41mm/px · 1 of 25 slices shown (21 of 21)]
[im 1/25]
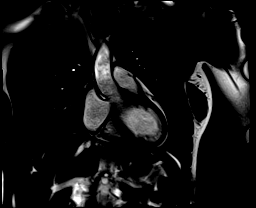

[Series 41: cor rvot · coronal · 6.0mm · 1.41mm/px · 1 of 25 slices shown (2 of 2)]
[im 1/25]
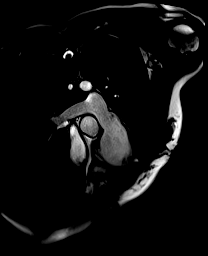

[Series 42: pulmonary valve flow_150_tp_retro_bh · oblique · 6.0mm · 1.73mm/px · 1 of 30 slices shown (1 of 2)]
[im 1/30]
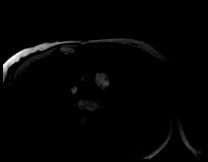

[Series 43: pulmonary valve flow_150_tp_retro_bh_mag · oblique · 6.0mm · 1.73mm/px · 1 of 30 slices shown (1 of 2)]
[im 1/30]
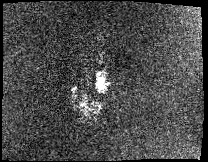

[Series 44: pulmonary valve flow_150_tp_retro_bh_p · oblique · 6.0mm · 1.73mm/px · 1 of 30 slices shown (1 of 2)]
[im 1/30]
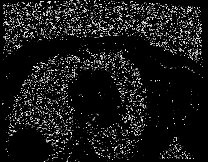

[Series 45: pulmonary valve flow_150_tp_retro_bh · oblique · 6.0mm · 1.73mm/px · 1 of 30 slices shown (2 of 2)]
[im 1/30]
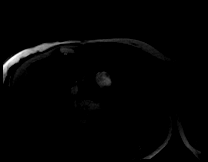

[Series 46: pulmonary valve flow_150_tp_retro_bh_mag · oblique · 6.0mm · 1.73mm/px · 1 of 30 slices shown (2 of 2)]
[im 1/30]
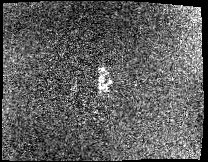

[Series 47: pulmonary valve flow_150_tp_retro_bh_p · oblique · 6.0mm · 1.73mm/px · 1 of 30 slices shown (2 of 2)]
[im 1/30]
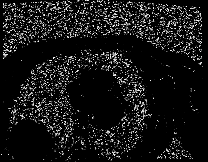

[Series 48: pre short axis · oblique · non-contrast · 8.0mm · 2.25mm/px · 1 of 10 slices shown (1 of 6)]
[im 1/10]
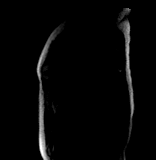

[Series 49: pre short axis · oblique · non-contrast · 8.0mm · 2.25mm/px · 1 of 10 slices shown (2 of 6)]
[im 1/10]
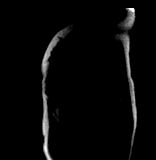

[Series 50: pre short axis · oblique · non-contrast · 8.0mm · 2.25mm/px · 1 of 10 slices shown (3 of 6)]
[im 1/10]
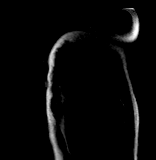

[Series 51: pre short axis · oblique · non-contrast · 8.0mm · 2.25mm/px · 1 of 10 slices shown (4 of 6)]
[im 1/10]
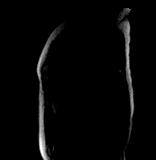

[Series 52: pre short axis · oblique · non-contrast · 8.0mm · 2.25mm/px · 1 of 10 slices shown (5 of 6)]
[im 1/10]
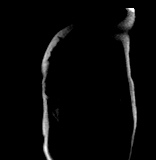

[Series 53: pre short axis · oblique · non-contrast · 8.0mm · 2.25mm/px · 1 of 10 slices shown (6 of 6)]
[im 1/10]
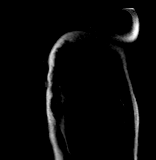

[43 of 48 positions shown; findings below may reference images not displayed]

FINDINGS: 1. Normal left ventricular size, thickness and systolic function
(LVEF = 54%). There are no regional wall motion abnormalities.

There is no late gadolinium enhancement in the left ventricular
myocardium.

LVEDV:134 ml

LVEDVi: 77 ml/m2

LVESV: 62 ml

LVESVi: 36 ml/m2

SV: 72 ml

CO: 4.6 L/min

Myocardial mass: 82g

2. Moderately dilated right ventricular size. Normal RV wall
thickness, Low normal RV systolic function (RVEF = 49%). There are
no regional wall motion abnormalities.

RVEDV: 254.2 ml

RVEDVi: 147 ml/m2

RVESV: 130.7 ml

RVESVi: 76 ml/m2

3.  Normal left and right atrial size.

4. Normal size of the aortic root, ascending aorta and pulmonary
artery.

5. Severe pulmonary valve regurgitation. Regurgitant Volume 54ml,
Regurgitant fraction 52.3%.

Mild tricuspid regurgitation.

6.  Normal pericardium.  No pericardial effusion.
IMPRESSION: 1. Severe Pulmonary valve regurgitation. Regurgitant Volume 54ml,
Regurgitant fraction 52.3%.

2. Moderately dilated right ventricle. RVEDVi =147 ml/m2, RVESVi =76
ml/m2.

3.  Low normal RV systolic function, RVEF 49%

4.  Normal LV size and function, LVEF 54%

[place] Pressure

## 2023-02-16 ENCOUNTER — Ambulatory Visit
Admission: EM | Admit: 2023-02-16 | Discharge: 2023-02-16 | Disposition: A | Discharge status: Home / Self Care | Payer: BC Managed Care – PPO | Attending: Emergency Medicine | Admitting: Emergency Medicine

## 2023-02-16 DIAGNOSIS — J0110 Acute frontal sinusitis, unspecified: Secondary | ICD-10-CM

## 2023-02-16 MED ORDER — AMOXICILLIN-POT CLAVULANATE 875-125 MG PO TABS: 1.0000 | ORAL_TABLET | Freq: Two times a day (BID) | ORAL | 0 refills | Status: AC

## 2023-02-16 NOTE — ED Triage Notes (Signed)
Patient presents to UC for a cough and sinus pressure x 2 weeks. Sinus pressure worse yesterday and had also noted green mucous. Treating with mucinex.

## 2023-02-16 NOTE — Discharge Instructions (Signed)
Today you are being treated for sinus infection  Begin Augmentin every morning and every evening for 10 days, will take 48 hours to fully get in your system but she sees small improvements type a day    You can take Tylenol and/or Ibuprofen as needed for fever reduction and pain relief.   For cough: honey 1/2 to 1 teaspoon (you can dilute the honey in water or another fluid).  You can also use guaifenesin and dextromethorphan for cough. You can use a humidifier for chest congestion and cough.  If you don't have a humidifier, you can sit in the bathroom with the hot shower running.      For sore throat: try warm salt water gargles, cepacol lozenges, throat spray, warm tea or water with lemon/honey, popsicles or ice, or OTC cold relief medicine for throat discomfort.   For congestion: take a daily anti-histamine like Zyrtec, Claritin, and a oral decongestant, such as pseudoephedrine.  You can also use Flonase 1-2 sprays in each nostril daily.   It is important to stay hydrated: drink plenty of fluids (water, gatorade/powerade/pedialyte, juices, or teas) to keep your throat moisturized and help further relieve irritation/discomfort.

## 2023-02-16 NOTE — ED Provider Notes (Signed)
[name]    CSN: [id number] Arrival date & time: 02/16/23  0802      History   Chief Complaint Chief Complaint  Patient presents with   Cough    HPI Kathleen Scott is a 37 y.o. female.   Patient presents for evaluation of intermittent generalized headache, nasal congestion, rhinorrhea, productive cough, sinus pain and pressure for 2 weeks.  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  Tolerating food and liquids.  Has been managing symptoms with Mucinex.  Denies shortness of breath and wheezing.  Past Medical History:  Diagnosis Date   Congenital Pulmonic Stenosis    a. 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 06/2015 Echo: EF 50-55%, septal wall HK 2/2 post-op state. Mild MR. Mildly dil RV size. Nl RV fxn. Triv PR. Nl PASP.   Heart murmur    Syncope and collapse     Patient Active Problem List   Diagnosis Date Noted   Congestion of nasal sinus 08/30/2019   Postpartum care following cesarean delivery 05/09/2018   Single liveborn, born in hospital, delivered by cesarean delivery 05/06/2018   Breech presentation delivered 05/06/2018   Arrest of dilation, delivered, current hospitalization 05/06/2018   Term pregnancy 05/05/2018   Supervision of low-risk pregnancy, second trimester 11/24/2017   H/O pulmonary valve stenosis 10/04/2017   Pulmonary valve disease 05/05/2014    Past Surgical History:  Procedure Laterality Date   CESAREAN SECTION N/A 05/06/2018   Procedure: CESAREAN SECTION;  Surgeon: Nadara Mustard, MD;  Location: [facility] ORS;  Service: Obstetrics;  Laterality: N/A;   open heart surgery     [facility]    OB History     Gravida  1   Para  1   Term  1   Preterm      AB      Living  1      SAB      IAB      Ectopic      Multiple  0   Live Births  1            Home Medications    Prior to Admission medications   Medication Sig Start Date End Date Taking? Authorizing  Provider  amoxicillin-clavulanate (AUGMENTIN) 875-125 MG tablet Take 1 tablet by mouth every 12 (twelve) hours for 10 days. 02/16/23 02/26/23 Yes Cheynne Virden, Elita Boone, NP  levonorgestrel (MIRENA) 20 MCG/24HR IUD 1 each by Intrauterine route once.    [provider]    Family History Family History  Family history unknown: Yes    Social History Social History   Tobacco Use   Smoking status: Never   Smokeless tobacco: Never  Vaping Use   Vaping status: Never Used  Substance Use Topics   Alcohol use: Not Currently   Drug use: No     Allergies   Patient has no known allergies.   Review of Systems Review of Systems  Respiratory:  Positive for cough.      Physical Exam Triage Vital Signs ED Triage Vitals  Encounter Vitals Group     BP 02/16/23 0823 131/85     Systolic BP Percentile --      Diastolic BP Percentile --      Pulse Rate 02/16/23 0823 76     Resp 02/16/23 0823 16     Temp 02/16/23 0823 97.8 F (36.6 C)     Temp Source 02/16/23 0823 Temporal  SpO2 02/16/23 0823 97 %     Weight --      Height --      Head Circumference --      Peak Flow --      Pain Score 02/16/23 0822 0     Pain Loc --      Pain Education --      Exclude from Growth Chart --    No data found.  Updated Vital Signs BP 131/85 (BP Location: Left Arm)   Pulse 76   Temp 97.8 F (36.6 C) (Temporal)   Resp 16   SpO2 97%   Visual Acuity Right Eye Distance:   Left Eye Distance:   Bilateral Distance:    Right Eye Near:   Left Eye Near:    Bilateral Near:     Physical Exam Constitutional:      Appearance: Normal appearance.  HENT:     Head: Normocephalic.     Right Ear: Tympanic membrane, ear canal and external ear normal.     Left Ear: Tympanic membrane, ear canal and external ear normal.     Nose: Congestion present. No rhinorrhea.     Right Turbinates: Swollen.     Left Turbinates: Swollen.     Right Sinus: Maxillary sinus tenderness and frontal sinus tenderness  present.     Mouth/Throat:     Mouth: Mucous membranes are moist.     Pharynx: Oropharynx is clear. No oropharyngeal exudate or posterior oropharyngeal erythema.  Eyes:     Extraocular Movements: Extraocular movements intact.  Cardiovascular:     Rate and Rhythm: Normal rate and regular rhythm.     Pulses: Normal pulses.     Heart sounds: Normal heart sounds.  Pulmonary:     Effort: Pulmonary effort is normal.     Breath sounds: Normal breath sounds.  Musculoskeletal:     Cervical back: Normal range of motion and neck supple.  Skin:    General: Skin is warm and dry.  Neurological:     Mental Status: She is alert and oriented to person, place, and time. Mental status is at baseline.      UC Treatments / Results  Labs (all labs ordered are listed, but only abnormal results are displayed) Labs Reviewed - No data to display  EKG   Radiology No results found.  Procedures Procedures (including critical care time)  Medications Ordered in UC Medications - No data to display  Initial Impression / Assessment and Plan / UC Course  I have reviewed the triage vital signs and the nursing notes.  Pertinent labs & imaging results that were available during my care of the patient were reviewed by me and considered in my medical decision making (see chart for details).  Acute nonrecurrent frontal sinusitis  Patient is in no signs of distress nor toxic appearing.  Vital signs are stable.  Low suspicion for pneumonia, pneumothorax or bronchitis and therefore will defer imaging.  Viral testing deferred due to timeline of illness.  Symptoms and presentation consistent with above diagnosis, symptoms present for 2 weeks therefore will provide bacterial coverage.  Prescribed Augmentin.May use additional over-the-counter medications as needed for supportive care.  May follow-up with urgent care as needed if symptoms persist or worsen.    Final Clinical Impressions(s) / UC Diagnoses   Final  diagnoses:  Acute non-recurrent frontal sinusitis     Discharge Instructions      Today you are being treated for sinus infection  Begin Augmentin every  morning and every evening for 10 days, will take 48 hours to fully get in your system but she sees small improvements type a day    You can take Tylenol and/or Ibuprofen as needed for fever reduction and pain relief.   For cough: honey 1/2 to 1 teaspoon (you can dilute the honey in water or another fluid).  You can also use guaifenesin and dextromethorphan for cough. You can use a humidifier for chest congestion and cough.  If you don't have a humidifier, you can sit in the bathroom with the hot shower running.      For sore throat: try warm salt water gargles, cepacol lozenges, throat spray, warm tea or water with lemon/honey, popsicles or ice, or OTC cold relief medicine for throat discomfort.   For congestion: take a daily anti-histamine like Zyrtec, Claritin, and a oral decongestant, such as pseudoephedrine.  You can also use Flonase 1-2 sprays in each nostril daily.   It is important to stay hydrated: drink plenty of fluids (water, gatorade/powerade/pedialyte, juices, or teas) to keep your throat moisturized and help further relieve irritation/discomfort.    ED Prescriptions     Medication Sig Dispense Auth. Provider   amoxicillin-clavulanate (AUGMENTIN) 875-125 MG tablet Take 1 tablet by mouth every 12 (twelve) hours for 10 days. 20 tablet Valinda Hoar, NP      PDMP not reviewed this encounter.   Valinda Hoar, [place] 02/16/23 [phone]
